# Patient Record
Sex: Female | Born: 1985 | Race: Black or African American | Hispanic: No | Marital: Married | Smoking: Never smoker
Health system: Southern US, Community
[De-identification: ages and names within clinical notes are randomized; demographics above are authoritative.]

## PROBLEM LIST (undated history)

## (undated) DIAGNOSIS — Z789 Other specified health status: Secondary | ICD-10-CM

## (undated) DIAGNOSIS — B181 Chronic viral hepatitis B without delta-agent: Secondary | ICD-10-CM

## (undated) DIAGNOSIS — I8390 Asymptomatic varicose veins of unspecified lower extremity: Secondary | ICD-10-CM

## (undated) HISTORY — DX: Asymptomatic varicose veins of unspecified lower extremity: I83.90

## (undated) HISTORY — DX: Chronic viral hepatitis B without delta-agent: B18.1

---

## 2006-12-08 ENCOUNTER — Ambulatory Visit (HOSPITAL_COMMUNITY): Admission: RE | Admit: 2006-12-08 | Discharge: 2006-12-08 | Payer: Self-pay | Admitting: Obstetrics and Gynecology

## 2007-04-20 ENCOUNTER — Ambulatory Visit (HOSPITAL_COMMUNITY): Admission: RE | Admit: 2007-04-20 | Discharge: 2007-04-20 | Payer: Self-pay | Admitting: Obstetrics

## 2007-05-04 ENCOUNTER — Inpatient Hospital Stay (HOSPITAL_COMMUNITY): Admission: AD | Admit: 2007-05-04 | Discharge: 2007-05-08 | Payer: Self-pay | Admitting: Obstetrics

## 2010-09-07 ENCOUNTER — Ambulatory Visit (HOSPITAL_COMMUNITY): Admission: RE | Admit: 2010-09-07 | Discharge: 2010-09-07 | Payer: Self-pay | Admitting: Obstetrics

## 2011-01-16 ENCOUNTER — Encounter: Payer: Self-pay | Admitting: Obstetrics

## 2011-02-02 ENCOUNTER — Inpatient Hospital Stay (HOSPITAL_COMMUNITY)
Admission: AD | Admit: 2011-02-02 | Discharge: 2011-02-05 | DRG: 765 | Disposition: A | Discharge status: Home / Self Care | Payer: Medicaid Other | Source: Ambulatory Visit | Attending: Obstetrics & Gynecology | Admitting: Obstetrics & Gynecology

## 2011-02-02 ENCOUNTER — Inpatient Hospital Stay (HOSPITAL_COMMUNITY)
Admission: AD | Admit: 2011-02-02 | Discharge: 2011-02-02 | Disposition: A | Discharge status: Home / Self Care | Payer: Medicaid Other | Source: Ambulatory Visit | Attending: Obstetrics | Admitting: Obstetrics

## 2011-02-02 DIAGNOSIS — O479 False labor, unspecified: Secondary | ICD-10-CM | POA: Insufficient documentation

## 2011-02-02 DIAGNOSIS — O34219 Maternal care for unspecified type scar from previous cesarean delivery: Secondary | ICD-10-CM | POA: Diagnosis present

## 2011-02-02 DIAGNOSIS — B182 Chronic viral hepatitis C: Secondary | ICD-10-CM | POA: Diagnosis present

## 2011-02-02 DIAGNOSIS — O26619 Liver and biliary tract disorders in pregnancy, unspecified trimester: Secondary | ICD-10-CM | POA: Diagnosis present

## 2011-02-02 LAB — CBC
Hemoglobin: 14.3 g/dL (ref 12.0–15.0)
MCH: 30.8 pg (ref 26.0–34.0)
MCHC: 35.8 g/dL (ref 30.0–36.0)
RBC: 4.65 MIL/uL (ref 3.87–5.11)

## 2011-02-02 LAB — RPR: RPR Ser Ql: NONREACTIVE

## 2011-02-03 LAB — CBC
HCT: 31.3 % — ABNORMAL LOW (ref 36.0–46.0)
Hemoglobin: 11 g/dL — ABNORMAL LOW (ref 12.0–15.0)
MCH: 30.1 pg (ref 26.0–34.0)
MCHC: 35.1 g/dL (ref 30.0–36.0)
Platelets: 124 10*3/uL — ABNORMAL LOW (ref 150–400)
RDW: 15.6 % — ABNORMAL HIGH (ref 11.5–15.5)

## 2011-02-09 NOTE — Discharge Summary (Signed)
  [name], Glenda [name]             ACCOUNT NO.:  [id number]  MEDICAL RECORD NO.:  [id number]           PATIENT TYPE:  I  LOCATION:  9118                          [facility]  [facility]  PHYSICIAN:  Kathreen Cosier, M.D.DATE OF BIRTH:  September 17, 1986  DATE OF ADMISSION:  02/02/2011 DATE OF DISCHARGE:  02/05/2011                              DISCHARGE SUMMARY   The patient is a 25 year old gravida 1 patient of Dr. Tamela Oddi who was admitted in labor on February 02, 2011.  Her EDC was January 28, 2011.  She labored actively and then underwent a low transverse cesarean section because of failure to progress.  She had a female Apgar 9 and 9, weighing 8 pounds from the OP position.  Postoperatively, she did well postpartum.  Her hemoglobin is 11.0.  She was discharged home on the third postoperative day ambulatory on a regular diet, to see me in 6 weeks.  DISCHARGE DIAGNOSIS:  Status post primary low transverse cesarean section at term for failure to progress in labor.          ______________________________ Kathreen Cosier, M.D.     BAM/MEDQ  D:  02/05/2011  T:  02/05/2011  Job:  [id number]  Electronically Signed by Francoise Ceo M.D. on 02/09/2011 07:18:14 AM

## 2011-02-13 NOTE — Op Note (Signed)
[name], [name]             ACCOUNT NO.:  [id number]  MEDICAL RECORD NO.:  [id number]           PATIENT TYPE:  I  LOCATION:  9118                          [facility]  PHYSICIAN:  Roseanna Rainbow, M.D.DATE OF BIRTH:  Feb 06, 1986  DATE OF PROCEDURE:  02/02/2011 DATE OF DISCHARGE:                              OPERATIVE REPORT   PREOPERATIVE DIAGNOSES:  Intrauterine pregnancy at term, history of a previous cesarean delivery, protracted latent phase of labor.  POSTOPERATIVE DIAGNOSES:  Intrauterine pregnancy at term, history of a previous cesarean delivery, protracted latent phase of labor.  PROCEDURE:  Repeat low uterine flap elliptical cesarean delivery via transverse incision.  SURGEON:  Roseanna Rainbow, M.D.  ANESTHESIA:  Epidural.  ESTIMATED BLOOD LOSS:  500 mL.  IV FLUIDS:  As per anesthesiology.  URINE OUTPUT:  As per anesthesiology.  COMPLICATIONS:  None.  FINDINGS:  Live born female, cephalic presentation, loose nuchal cord x1. Birth weight 8 pounds.  PROCEDURE:  The patient was taken to the operating room with an IV running and an epidural catheter in situ.  The patient was placed in the dorsal supine position with a leftward tilt and prepped and draped in the usual sterile fashion.  After time-out had been completed, the previous scar was then incised with the scalpel.  This incision was then extended down to the fascia with the Bovie.  The fascia was nicked in the midline.  The fascial incision was then extended bilaterally.  The superior aspect of the fascial incision was tented up and the underlying rectus muscles dissected off.  The inferior aspect of the fascial incision was manipulated in a similar fashion.  The rectus muscle was separated in the midline.  The parietal peritoneum was entered sharply. This incision was then extended superiorly and inferiorly with good visualization of the bladder.  The bladder blade was then placed.   The vesicouterine peritoneum was tented up and entered sharply.  This incision was then extended bilaterally.  The bladder flap was created using both sharp and blunt dissection.  The bladder blade was then replaced.  The lower uterine segment was incised in a transverse fashion with the scalpel.  This incision was extended bluntly.  The infant's head was delivered atraumatically.  The cord was clamped and cut.  The infant was handed off to the awaiting neonatologist.  The placenta was then removed.  The intrauterine cavity was evacuated of any remaining amniotic fluid, clots, and debris with a moistened laparotomy sponge. The uterine incision was then reapproximated in a running interlocking fashion and with suture of 0 Monocryl.  The second imbricating layer of the same suture was then placed.  Adequate hemostasis was noted.  The paracolic gutters were then irrigated.  The parietal peritoneum was reapproximated in a running fashion using a suture of 2-0 Vicryl.  The fascia was reapproximated with two running sutures of 0 PDS tied in the midline.  The skin was closed in a subcuticular fashion using a suture of 4-0 Monocryl.  At the close of the procedure, the instrument and pack counts were said to be correct x2.  The patient  taken to the PACU awake and in stable condition.     Roseanna Rainbow, M.D.     [name]  D:  02/02/2011  T:  02/03/2011  Job:  [id number]  Electronically Signed by Antionette Char M.D. on 02/13/2011 02:34:22 PM

## 2011-02-13 NOTE — H&P (Signed)
  NAMEADAMAE, Glenda [name]             ACCOUNT NO.:  [id number]  MEDICAL RECORD NO.:  [id number]           PATIENT TYPE:  I  LOCATION:  9118                          [facility]  PHYSICIAN:  Roseanna Rainbow, M.D.DATE OF BIRTH:  Aug 21, 1986  DATE OF ADMISSION:  02/02/2011 DATE OF DISCHARGE:                             HISTORY & PHYSICAL   CHIEF COMPLAINT:  The patient is a 25 year old para 1 with an estimated date of confinement of February 02, 2011, complaining of spontaneous rupture of membranes and contractions.  HISTORY OF PRESENT ILLNESS:  Please see the above.  The patient reports a spontaneous rupture of membranes for clear fluid 2 hours prior to presentation.  ALLERGIES:  No known drug allergies.  MEDICATIONS:  Please see the medication reconciliation form.  OB RISK FACTORS:  History of a previous cesarean delivery.  PRENATAL LABS:  Urine culture and sensitivity, no growth.  Hepatitis B surface antigen positive.  Platelets 177,000, blood type is A+, antibody screen negative, RPR nonreactive.  Quad screen negative.  Hemoglobin 11.8, hematocrit 34, HIV nonreactive.  Two-hour GTT normal.  PAST OB HISTORY:  In May 2008, she was delivered at term 7 pounds 12 ounces female via cesarean delivery.  PAST GYN HISTORY:  Noncontributory.  PAST MEDICAL HISTORY:  No significant history of medical diseases.  PAST SURGICAL HISTORY:  Please see the above.  SOCIAL HISTORY:  She is a Lawyer.  She is married, living with her spouse, does not give any significant history of alcohol usage, has no significant smoking history.  Denies illicit drug use.  FAMILY HISTORY:  No major illnesses known.  REVIEW OF SYSTEMS:  GU:  Please see the above.  PHYSICAL EXAMINATION:  VITAL SIGNS:  Stable and afebrile.  Fetal heart tracing baseline 140 beats per minute, moderate long-term variability, no decelerations.  Tocodynamometer uterine contractions every 4 minutes with coupling, sterile vaginal  exam per the RN, and there is gross rupture.  The cervix is 3 cm dilated, 100% effaced.  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  PLAN:  Admission, epidural for analgesia.  Expectant management, anticipated spontaneous vaginal delivery.     Roseanna Rainbow, M.D.     [name]  D:  02/02/2011  T:  02/02/2011  Job:  [id number]  Electronically Signed by Antionette Char M.D. on 02/13/2011 02:34:02 PM

## 2011-05-10 NOTE — H&P (Signed)
NAMEKINNLEY, PAULSON               ACCOUNT NO.:  [id number]   MEDICAL RECORD NO.:  [id number]          PATIENT TYPE:  INP   LOCATION:  9119                          [facility]  [facility]   PHYSICIAN:  Kathreen Cosier, M.D.DATE OF BIRTH:  [birth year]   DATE OF ADMISSION:  05/04/2007  DATE OF DISCHARGE:                              HISTORY & PHYSICAL   The patient is a 25 year old primigravida who was due May 12 who was  admitted at 1:00 on the 9th. History and physical was repeated by Dr.  Tamela Oddi. The patient was initially 3 cm, and by 6:05 p.m., she was  8-9. Vertex were -2. Membranes ruptured, fluid clear. The vertex was not  well applied to the cervix. By 10:40 p.m., the cervix appeared to be 7  cm with some molding to -1 station. She was started on low-dose Pitocin,  and her contractions never did become adequate, and the Pitocin was  discontinued, and because there was still no progress and she was  molded, she started having decelerations and had spontaneous  decelerations to 90 for 7 minutes. She was on O2. There was no change in  her pelvic findings. It was decided she would be delivered by cesarean  section for failure to progress and nonreassuring fetal heart tracing.           ______________________________  Kathreen Cosier, M.D.     BAM/MEDQ  D:  05/05/2007  T:  05/05/2007  Job:  [id number]

## 2011-05-10 NOTE — H&P (Signed)
NAMESHEMICKA, COHRS               ACCOUNT NO.:  [id number]   MEDICAL RECORD NO.:  [id number]          PATIENT TYPE:  INP   LOCATION:  9171                          [facility]   PHYSICIAN:  Roseanna Rainbow, M.D.DATE OF BIRTH:  09-17-1986   DATE OF ADMISSION:  05/04/2007  DATE OF DISCHARGE:                              HISTORY & PHYSICAL   CHIEF COMPLAINT:  The patient is a 25 year old gravida 1, para 0, with  an estimated date of confinement of May 12 with an intrauterine  pregnancy at 39+ weeks, complaining of uterine contractions.   HISTORY OF PRESENT ILLNESS:  Please see the above.   OB RISK FACTORS:  AC trait, hepatitis B surface antigen positive, with  associated abnormal liver transaminases, poor dentition, history of a  positive PPD, is status post INH.   PRENATAL LABS:  Hemoglobin 13.4, hematocrit 39.3, plate 621,308, blood  type A positive, antibody screen negative, RPR nonreactive, hepatitis B  surface antigen positive, rubella immune.  Hemoglobin electrophoresis  AC.  Varicella titer immune.  HIV nonreactive.  GC negative.  Chlamydia  negative.  Pap smear negative.  Urine culture and sensitivity:  No  uropathogens.  Quad screen normal.  One-hour GTT 82.  Ultrasound on  December 14 at 19 weeks 6 days.  Estimated date of confinement by  ultrasound Apr 28, 2007.  GBS is negative.   PAST GYN HISTORY:  Noncontributory.   PAST MEDICAL HISTORY:  Please see the above.   PAST SURGICAL HISTORY:  She denies.   SOCIAL HISTORY:  She denies any tobacco, ethanol or drug use.   FAMILY HISTORY:  Hypertension, diabetes.   ALLERGIES:  No known drug allergies.   PHYSICAL EXAMINATION:  VITAL SIGNS:  Stable.  Afebrile.  Fetal heart  tracing reassuring.  Toco dynamometer:  Uterine contractions every five  minutes.  STERILE VAGINAL EXAM:  Per the RN the cervix is 3 cm dilated, 80%  effaced.   ASSESSMENT:  Primigravida with an intrauterine pregnancy at term, latent  labor,  fetal heart tracing consistent with fetal well-being.   PLAN:  Admission.  Expectant management.      Roseanna Rainbow, M.D.  Electronically Signed     LAJ/MEDQ  D:  05/04/2007  T:  05/04/2007  Job:  [id number]

## 2011-05-10 NOTE — Op Note (Signed)
NAMEBEVA, REMUND               ACCOUNT NO.:  [id number]   MEDICAL RECORD NO.:  [id number]          PATIENT TYPE:  INP   LOCATION:  9119                          [facility]   PHYSICIAN:  Kathreen Cosier, M.D.DATE OF BIRTH:  1986-06-15   DATE OF PROCEDURE:  05/05/2007  DATE OF DISCHARGE:                               OPERATIVE REPORT   PREOPERATIVE DIAGNOSIS:  Failure to progress in labor, nonreassuring  fetal heart rate tracing.   SURGEON:  Dr. Gaynell Face.   ANESTHESIA:  Spinal.   PROCEDURE:  The patient placed on the operating table in the supine  position, after spinal administered by Dr. Arby Barrette.  Abdomen prepped  and draped.  Bladder emptied with Foley catheter.  Transverse suprapubic  incision made, carried down through the rectus fascia.  Fascia cleaned  and incised the length of incision.  Recti muscles retracted laterally.  Peritoneum incised longitudinally.  Transverse incision made in the  visceral peritoneum above the bladder and the bladder mobilized  inferiorly.  Transverse lower uterine incision made.  The patient  delivered of a female, Apgars 7 pounds 12 ounces from the OP position.  The team was in attendance.  Fluid was clear.  Placenta was anterior  fundal, removed manually and sent to labor and delivery.  Uterine cavity  cleaned with dry laps.  Uterine incision closed in 1 layer with  continuous suture of #1 chromic.  Hemostasis was satisfactory.  Bladder  flap reattached with 2-0 chromic.  Uterus well contracted.  Tubes and  ovaries normal.  Abdomen closed in layers, peritoneum continuous suture  of 0 chromic, fascia continuous suture of 0 Dexon, and the skin closed  with subcuticular stitch of 4-0 Monocryl.  Blood loss 500 mL.           ______________________________  Kathreen Cosier, M.D.     BAM/MEDQ  D:  05/05/2007  T:  05/05/2007  Job:  [id number]

## 2011-05-13 NOTE — Discharge Summary (Signed)
Glenda [name], Glenda [name]               ACCOUNT NO.:  [id number]   MEDICAL RECORD NO.:  [id number]          PATIENT TYPE:  INP   LOCATION:  9119                          [facility]   PHYSICIAN:  Roseanna Rainbow, M.D.DATE OF BIRTH:  1986/04/01   DATE OF ADMISSION:  05/04/2007  DATE OF DISCHARGE:  05/08/2007                               DISCHARGE SUMMARY   CHIEF COMPLAINT:  The patient is a 25 year old gravida 1, para 0, with  an estimated date of confinement of May 07, 2007, with an intrauterine  pregnancy of 39+ weeks, complaining of uterine contractions.   Please see the dictated history and physical for further details.  The  patient was admitted.  She progressed to 6-to-7-cm of dilatation.  At  this point there were variable decelerations and an amnioinfusion was  started.  There was no further progress and at that point a decision was  made to proceed with a cesarean delivery.  Please see the dictated  operative summary.   Her postoperative course was uneventful.  She was discharged to home on  postoperative day #3, tolerating a regular diet.   DISCHARGE DIAGNOSES:  1. Intrauterine pregnancy at term.  2. Arrest of dilatation active phase of labor.   PROCEDURE:  Primary low uterine elliptical cesarean delivery.   CONDITION:  Good.   DIET:  Regular.   ACTIVITY:  Progressive activity, pelvic rest.   MEDICATIONS:  Percocet.   DISPOSITION:  The patient was to follow up in the office in two weeks.      Roseanna Rainbow, M.D.  Electronically Signed     LAJ/MEDQ  D:  06/08/2007  T:  06/08/2007  Job:  [id number]

## 2012-05-15 ENCOUNTER — Ambulatory Visit (INDEPENDENT_AMBULATORY_CARE_PROVIDER_SITE_OTHER): Payer: 59 | Admitting: Internal Medicine

## 2012-05-15 ENCOUNTER — Ambulatory Visit: Payer: 59

## 2012-05-15 VITALS — BP 124/88 | HR 74 | Temp 97.6°F | Resp 16 | Ht 62.0 in | Wt 153.0 lb

## 2012-05-15 DIAGNOSIS — M546 Pain in thoracic spine: Secondary | ICD-10-CM

## 2012-05-15 MED ORDER — MELOXICAM 15 MG PO TABS: 15.0000 mg | ORAL_TABLET | Freq: Every day | ORAL | Status: AC

## 2012-05-15 MED ORDER — CYCLOBENZAPRINE HCL 10 MG PO TABS: ORAL_TABLET | ORAL | Status: DC

## 2012-05-15 NOTE — Progress Notes (Signed)
  Subjective:    Patient ID: Maragret Vanacker, female    DOB: 06-30-1986, 26 y.o.   MRN: [id number]  HPIShe has a 9 month history of intermittent pain in the mid back and occasionally lower back  That gets worse with her work. 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 There is no history of injury. This pain can interfere with sleep when it is exacerbated after lifting. There is no radicular component and she has no numbness anywhere.   Past hx=No medical illnesses/mirena/2 successful pregnancies/  Review of Systems  Constitutional: Negative for fever, chills, fatigue and unexpected weight change.  Respiratory: Negative for cough, chest tightness and shortness of breath.   Cardiovascular: Negative for palpitations and leg swelling.  Genitourinary: Negative for difficulty urinating.  Musculoskeletal: Negative for gait problem.       Objective:   Physical ExamVital signs stable Neck exam full range of motion without pain Both shoulders have full range of motion without pain except for mild impingement symptoms on the left with abduction greater than 90 Thoracic spine has a mild scoliosis to the right She is tender along the scapular border and into the right trapezius She is tender in the parathoracic muscles down to T12 Pain with twisting and tilting  And forward flexion Lumbar spine is intact with negative straight leg raise Deep tendon reflexes are symmetrical    [facility] reading (PRIMARY) by  Dr.Karlyn Glasco=Mild thoracic scoliosis but no other findings   Assessment & Plan:  Problem #1 thoracic pain secondary to chronic thoracic strain with mild scoliosis Meds ordered this encounter  Medications  . Multiple Vitamin (MULTIVITAMIN) capsule    Sig: Take 1 capsule by mouth daily.  Marland Kitchen levonorgestrel (MIRENA) 20 MCG/24HR IUD    Sig: 1 each by Intrauterine route once.  . cyclobenzaprine (FLEXERIL) 10 MG tablet    Sig: One at bedtime    Dispense:  30 tablet    Refill:  0  .  meloxicam (MOBIC) 15 MG tablet    Sig: Take 1 tablet (15 mg total) by mouth daily.    Dispense:  30 tablet    Refill:  0   Patient Instructions  Do your exercises daily Medicines have been sent to your pharmacy= [facility] in [place] We need to recheck you back in 3 weeks-you may call [phone] to find out what day I am scheduled for work that is convenient for you to return   Letter has been given to use indicating light duty lifting less than 20 pounds until followup exam

## 2012-05-15 NOTE — Patient Instructions (Addendum)
Do your exercises daily Medicines have been sent to your pharmacy= [facility] in [place] We need to recheck you back in 3 weeks-you may call [phone] to find out what day I am scheduled for work that is convenient for you to return

## 2012-05-17 ENCOUNTER — Telehealth: Payer: Self-pay

## 2012-05-17 NOTE — Telephone Encounter (Signed)
Pt called again to request to stay home for a couple of days to see how the medicine works for her.

## 2012-05-17 NOTE — Telephone Encounter (Signed)
PATIENT STATES SHE WAS SEEN 2 DAYS AGO BY DR. Merla Riches FOR BACK PAIN. HE GAVE HER CYCLOBENZAPRINE 10MG . THIS MEDICATION MAKES HER FEEL WEAK AND DIZZY. SHE WOULD LIKE TO BE OUT OF WORK FOR A FEW DAYS. SHE IS REQUESTING A OUT-OF-WORK NOTE PLEASE.  BEST PHONE [phone] (CELL)     PHARMACY CHOICE IS [facility] ON [address]       MBC

## 2012-05-17 NOTE — Telephone Encounter (Signed)
Do you want to OK OOW note?

## 2012-05-18 NOTE — Telephone Encounter (Signed)
Yes, OOW note ok

## 2012-05-20 NOTE — Telephone Encounter (Signed)
Called patient. Left message for patient to call back with the days she needed [place] to include.

## 2012-05-21 NOTE — Telephone Encounter (Signed)
Spoke w/pt and she stated she will just RTC today and be seen by provider bc Dr Merla Riches had written note that she could not lift over 20-30 lbs and her work is not allowing her to work saying there is not any light duty work for her. Also the medicine is still making her feel weak.

## 2012-05-30 ENCOUNTER — Telehealth: Payer: Self-pay

## 2012-05-30 NOTE — Telephone Encounter (Signed)
PATIENT IS REQUESTING THE X-RAYS OF HER BACK SHE HAD DONE A FEW WEEKS AGO. PLEASE CALL HER WHEN THEY ARE READY TO BE PICKED UP. BEST PHONE [phone] (CELL)  MBC

## 2012-05-31 NOTE — Telephone Encounter (Signed)
LMOM that copy of Xrays is ready for p/up

## 2012-06-01 ENCOUNTER — Telehealth: Payer: Self-pay

## 2012-06-01 NOTE — Telephone Encounter (Signed)
.  [facility] The patient called possibly to request referral from her visit on 05/15/12.  No referral noted in system.  Please call patient to discuss her needs regarding referral.

## 2012-06-02 NOTE — Telephone Encounter (Signed)
LMOM to CB.  Get info re: referral... Patient was to RTC.

## 2012-06-04 NOTE — Telephone Encounter (Signed)
LMOM to CB. 

## 2012-06-05 NOTE — Telephone Encounter (Signed)
Pt came in states she filled out form for release of records, she is filling out another one and please call when ready for pick up.Marland Kitchen

## 2012-06-05 NOTE — Telephone Encounter (Signed)
Pt told Glenda [name] when she filled out form that she did not need a referral, only needed her records. Med Recs were given the release of recs form.

## 2012-06-05 NOTE — Telephone Encounter (Signed)
LMOM regarding records are ready for pick-up. °

## 2012-06-25 ENCOUNTER — Ambulatory Visit: Payer: 59 | Attending: Internal Medicine

## 2012-06-25 DIAGNOSIS — M546 Pain in thoracic spine: Secondary | ICD-10-CM | POA: Insufficient documentation

## 2012-06-25 DIAGNOSIS — IMO0001 Reserved for inherently not codable concepts without codable children: Secondary | ICD-10-CM | POA: Insufficient documentation

## 2012-06-27 ENCOUNTER — Encounter: Payer: 59 | Admitting: Physical Therapy

## 2012-07-05 ENCOUNTER — Encounter: Payer: 59 | Admitting: Physical Therapy

## 2012-07-05 ENCOUNTER — Ambulatory Visit: Payer: 59 | Admitting: Physical Therapy

## 2012-07-11 ENCOUNTER — Ambulatory Visit: Payer: 59 | Admitting: Physical Therapy

## 2012-07-31 ENCOUNTER — Ambulatory Visit: Payer: 59 | Admitting: Physical Therapy

## 2013-06-17 ENCOUNTER — Encounter: Payer: Self-pay | Admitting: Obstetrics

## 2013-07-24 ENCOUNTER — Encounter: Payer: 59 | Admitting: Obstetrics

## 2013-07-25 ENCOUNTER — Encounter: Payer: Self-pay | Admitting: Obstetrics

## 2013-07-25 ENCOUNTER — Ambulatory Visit (INDEPENDENT_AMBULATORY_CARE_PROVIDER_SITE_OTHER): Payer: BC Managed Care – PPO | Admitting: Obstetrics

## 2013-07-25 VITALS — BP 110/73 | Temp 97.7°F | Wt 163.0 lb

## 2013-07-25 DIAGNOSIS — Z369 Encounter for antenatal screening, unspecified: Secondary | ICD-10-CM

## 2013-07-25 DIAGNOSIS — Z3482 Encounter for supervision of other normal pregnancy, second trimester: Secondary | ICD-10-CM

## 2013-07-25 DIAGNOSIS — Z3201 Encounter for pregnancy test, result positive: Secondary | ICD-10-CM

## 2013-07-25 LAB — POCT URINALYSIS DIPSTICK
Bilirubin, UA: NEGATIVE
Blood, UA: NEGATIVE
Glucose, UA: NEGATIVE
Ketones, UA: NEGATIVE
Leukocytes, UA: NEGATIVE
Spec Grav, UA: 1.02

## 2013-07-25 MED ORDER — VITAFOL-OB PO TABS: 1.0000 | ORAL_TABLET | Freq: Every day | ORAL | Status: DC

## 2013-07-25 NOTE — Progress Notes (Signed)
Pulse-89   Subjective:    Glenda [name] is being seen today for her first obstetrical visit.  This is a planned pregnancy. She is at [redacted]w[redacted]d gestation. Her obstetrical history is significant for none. Relationship with FOB: spouse, living together. Patient does intend to breast feed. Pregnancy history fully reviewed.  Menstrual History: OB History   Grav Para Term Preterm Abortions TAB SAB Ect Mult Living   3 2 2              Menarche age: 27  Patient's last menstrual period was 04/06/2013.    The following portions of the patient's history were reviewed and updated as appropriate: allergies, current medications, past family history, past medical history, past social history, past surgical history and problem list.  Review of Systems Pertinent items are noted in HPI.    Objective:    General appearance: alert and no distress Abdomen: normal findings: soft, non-tender Pelvic: cervix normal in appearance, external genitalia normal, no adnexal masses or tenderness, no cervical motion tenderness, uterus normal size, shape, and consistency and vagina normal without discharge Extremities: extremities normal, atraumatic, no cyanosis or edema    Assessment:    Pregnancy at [redacted]w[redacted]d weeks    Plan:    Initial labs drawn. Prenatal vitamins.  Counseling provided regarding continued use of seat belts, cessation of alcohol consumption, smoking or use of illicit drugs; infection precautions i.e., influenza/TDAP immunizations, toxoplasmosis,CMV, parvovirus, listeria and varicella; workplace safety, exercise during pregnancy; routine dental care, safe medications, sexual activity, hot tubs, saunas, pools, travel, caffeine use, fish and methlymercury, potential toxins, hair treatments, varicose veins Weight gain recommendations reviewed: underweight/BMI< 18.5--> gain 28 - 40 lbs; normal weight/BMI 18.5 - 24.9--> gain 25 - 35 lbs; overweight/BMI 25 - 29.9--> gain 15 - 25 lbs; obese/BMI >30->gain  11 -  20 lbs Problem list reviewed and updated. AFP3 discussed: requested. Role of ultrasound in pregnancy discussed; fetal survey: requested. Amniocentesis discussed: not indicated. Follow up in 4 weeks. 50% of 20 min visit spent on counseling and coordination of care.

## 2013-07-26 LAB — OBSTETRIC PANEL
Antibody Screen: NEGATIVE
Basophils Relative: 0 % (ref 0–1)
Eosinophils Absolute: 0.1 10*3/uL (ref 0.0–0.7)
Eosinophils Relative: 1 % (ref 0–5)
HCT: 38 % (ref 36.0–46.0)
Lymphs Abs: 2.1 10*3/uL (ref 0.7–4.0)
Monocytes Relative: 6 % (ref 3–12)
Neutro Abs: 5.4 10*3/uL (ref 1.7–7.7)
RBC: 4.12 MIL/uL (ref 3.87–5.11)
WBC: 8.1 10*3/uL (ref 4.0–10.5)

## 2013-07-26 LAB — HIV ANTIBODY (ROUTINE TESTING W REFLEX): HIV: NONREACTIVE

## 2013-07-26 LAB — VARICELLA ZOSTER ANTIBODY, IGG: Varicella IgG: 54.37 Index (ref <135.00)

## 2013-07-26 LAB — VITAMIN D 25 HYDROXY (VIT D DEFICIENCY, FRACTURES): Vit D, 25-Hydroxy: 43 ng/mL (ref 30–89)

## 2013-07-26 LAB — PAP IG W/ RFLX HPV ASCU

## 2013-07-26 LAB — GC/CHLAMYDIA PROBE AMP: CT Probe RNA: NEGATIVE

## 2013-07-26 LAB — WET PREP BY MOLECULAR PROBE: Candida species: NEGATIVE

## 2013-07-26 LAB — HEPATITIS B SURF AG CONFIRMATION: Hepatitis B Surf Ag Confirmation: POSITIVE — AB

## 2013-07-27 LAB — URINE CULTURE: Colony Count: NO GROWTH

## 2013-07-29 LAB — AFP, QUAD SCREEN
AFP: 35.1 IU/mL
Age Alone: 1:834 {titer}
Down Syndrome Scr Risk Est: 1:10100 {titer}
INH: 214.1 pg/mL
MoM for INH: 1.15
Open Spina bifida: NEGATIVE
Trisomy 18 (Edward) Syndrome Interp.: 1:47900 {titer}
uE3 Mom: 1.12

## 2013-07-29 LAB — HEMOGLOBINOPATHY EVALUATION
Hemoglobin Other: 37 % — ABNORMAL HIGH
Hgb A: 59.9 % — ABNORMAL LOW (ref 96.8–97.8)
Hgb F Quant: 0 % (ref 0.0–2.0)
Hgb S Quant: 0 %

## 2013-08-05 LAB — HGB ELECTROPHORESIS REFLEXED REPORT
Hemoglobin A - HGBRFX: 57.2 % — ABNORMAL LOW (ref >96.0)
Hemoglobin A2 - HGBRFX: 3.2 % (ref 1.8–3.5)
Hemoglobin F - HGBRFX: 0 % (ref <2.0)
Sickle Solubility Test - HGBRFX: NEGATIVE

## 2013-08-20 ENCOUNTER — Ambulatory Visit (INDEPENDENT_AMBULATORY_CARE_PROVIDER_SITE_OTHER): Payer: BC Managed Care – PPO

## 2013-08-20 DIAGNOSIS — Z369 Encounter for antenatal screening, unspecified: Secondary | ICD-10-CM

## 2013-08-28 ENCOUNTER — Encounter: Payer: BC Managed Care – PPO | Admitting: Obstetrics

## 2013-08-29 ENCOUNTER — Encounter: Payer: BC Managed Care – PPO | Admitting: Obstetrics

## 2013-09-02 ENCOUNTER — Encounter: Payer: BC Managed Care – PPO | Admitting: Obstetrics

## 2013-09-05 ENCOUNTER — Ambulatory Visit (INDEPENDENT_AMBULATORY_CARE_PROVIDER_SITE_OTHER): Payer: BC Managed Care – PPO | Admitting: Obstetrics

## 2013-09-05 ENCOUNTER — Encounter: Payer: Self-pay | Admitting: Obstetrics

## 2013-09-05 VITALS — BP 109/69 | Temp 99.2°F | Wt 169.0 lb

## 2013-09-05 DIAGNOSIS — Z3480 Encounter for supervision of other normal pregnancy, unspecified trimester: Secondary | ICD-10-CM

## 2013-09-05 LAB — POCT URINALYSIS DIPSTICK
Bilirubin, UA: NEGATIVE
Blood, UA: NEGATIVE
Glucose, UA: NEGATIVE
Spec Grav, UA: >=1.03
pH, UA: 5

## 2013-09-05 NOTE — Progress Notes (Signed)
P 83 Patient states her varicose veins are worse. Patient states otherwise she is ok.

## 2013-10-03 ENCOUNTER — Other Ambulatory Visit: Payer: 59

## 2013-10-03 ENCOUNTER — Encounter: Payer: Self-pay | Admitting: Obstetrics

## 2013-10-03 ENCOUNTER — Ambulatory Visit (INDEPENDENT_AMBULATORY_CARE_PROVIDER_SITE_OTHER): Payer: Medicaid Other | Admitting: Obstetrics

## 2013-10-03 VITALS — BP 115/70 | Temp 96.7°F | Wt 171.0 lb

## 2013-10-03 DIAGNOSIS — Z3400 Encounter for supervision of normal first pregnancy, unspecified trimester: Secondary | ICD-10-CM

## 2013-10-03 DIAGNOSIS — Z3402 Encounter for supervision of normal first pregnancy, second trimester: Secondary | ICD-10-CM

## 2013-10-03 LAB — CBC
HCT: 34.4 % — ABNORMAL LOW (ref 36.0–46.0)
Hemoglobin: 12 g/dL (ref 12.0–15.0)
MCH: 33.3 pg (ref 26.0–34.0)
MCHC: 34.9 g/dL (ref 30.0–36.0)
RBC: 3.6 MIL/uL — ABNORMAL LOW (ref 3.87–5.11)

## 2013-10-03 LAB — POCT URINALYSIS DIPSTICK
Bilirubin, UA: NEGATIVE
Protein, UA: NEGATIVE
Spec Grav, UA: 1.01
Urobilinogen, UA: NEGATIVE
pH, UA: 7

## 2013-10-03 NOTE — Progress Notes (Signed)
Pulse- 89 Pt states she is still having pain with her varicose veins.

## 2013-10-04 LAB — SYPHILIS: RPR W/REFLEX TO RPR TITER AND TREPONEMAL ANTIBODIES, TRADITIONAL SCREENING AND DIAGNOSIS ALGORITHM

## 2013-10-17 ENCOUNTER — Ambulatory Visit (INDEPENDENT_AMBULATORY_CARE_PROVIDER_SITE_OTHER): Payer: Medicaid Other | Admitting: Obstetrics

## 2013-10-17 ENCOUNTER — Encounter: Payer: Self-pay | Admitting: Obstetrics

## 2013-10-17 VITALS — BP 115/70 | Temp 97.0°F | Wt 169.0 lb

## 2013-10-17 DIAGNOSIS — Z3482 Encounter for supervision of other normal pregnancy, second trimester: Secondary | ICD-10-CM

## 2013-10-17 DIAGNOSIS — Z3483 Encounter for supervision of other normal pregnancy, third trimester: Secondary | ICD-10-CM

## 2013-10-17 DIAGNOSIS — Z3480 Encounter for supervision of other normal pregnancy, unspecified trimester: Secondary | ICD-10-CM

## 2013-10-17 LAB — POCT URINALYSIS DIPSTICK
Bilirubin, UA: NEGATIVE
Blood, UA: NEGATIVE
Glucose, UA: NEGATIVE
Ketones, UA: NEGATIVE
Nitrite, UA: NEGATIVE
Protein, UA: NEGATIVE
Spec Grav, UA: 1.01
Urobilinogen, UA: NEGATIVE
pH, UA: 7

## 2013-10-17 NOTE — Progress Notes (Signed)
HR-93 Routine OB visit, no new concerns at present, reports pressure to lower abd and pelvis, report infrequent irregular painless contractions.

## 2013-10-24 ENCOUNTER — Encounter: Payer: BC Managed Care – PPO | Admitting: Obstetrics

## 2013-10-31 ENCOUNTER — Encounter: Payer: 59 | Admitting: Obstetrics

## 2013-10-31 ENCOUNTER — Encounter: Payer: 59 | Admitting: Obstetrics & Gynecology

## 2013-11-07 ENCOUNTER — Encounter: Payer: Self-pay | Admitting: Obstetrics & Gynecology

## 2013-11-07 ENCOUNTER — Ambulatory Visit (INDEPENDENT_AMBULATORY_CARE_PROVIDER_SITE_OTHER): Payer: 59 | Admitting: Obstetrics & Gynecology

## 2013-11-07 VITALS — BP 110/68 | Temp 97.0°F | Wt 171.0 lb

## 2013-11-07 DIAGNOSIS — Z3403 Encounter for supervision of normal first pregnancy, third trimester: Secondary | ICD-10-CM

## 2013-11-07 DIAGNOSIS — Z3400 Encounter for supervision of normal first pregnancy, unspecified trimester: Secondary | ICD-10-CM

## 2013-11-07 LAB — POCT URINALYSIS DIPSTICK
Bilirubin, UA: NEGATIVE
Blood, UA: NEGATIVE
Ketones, UA: NEGATIVE
Protein, UA: NEGATIVE
pH, UA: 7

## 2013-11-07 NOTE — Progress Notes (Signed)
Pulse-90 Pt states she has contractions and pain that come every now and then

## 2013-11-18 ENCOUNTER — Encounter: Payer: Self-pay | Admitting: Obstetrics

## 2013-11-28 ENCOUNTER — Ambulatory Visit (INDEPENDENT_AMBULATORY_CARE_PROVIDER_SITE_OTHER): Payer: Medicaid Other | Admitting: Obstetrics & Gynecology

## 2013-11-28 ENCOUNTER — Encounter: Payer: Self-pay | Admitting: Obstetrics & Gynecology

## 2013-11-28 ENCOUNTER — Encounter: Payer: 59 | Admitting: Obstetrics & Gynecology

## 2013-11-28 VITALS — BP 119/75 | Temp 97.1°F | Wt 173.0 lb

## 2013-11-28 DIAGNOSIS — Z3400 Encounter for supervision of normal first pregnancy, unspecified trimester: Secondary | ICD-10-CM

## 2013-11-28 DIAGNOSIS — Z23 Encounter for immunization: Secondary | ICD-10-CM

## 2013-11-28 DIAGNOSIS — Z3403 Encounter for supervision of normal first pregnancy, third trimester: Secondary | ICD-10-CM

## 2013-11-28 DIAGNOSIS — N390 Urinary tract infection, site not specified: Secondary | ICD-10-CM

## 2013-11-28 LAB — POCT URINALYSIS DIPSTICK
Bilirubin, UA: NEGATIVE
Ketones, UA: NEGATIVE
Urobilinogen, UA: NEGATIVE

## 2013-11-28 NOTE — Progress Notes (Signed)
Pulse- 100 Pt states she is having some lower abdomen pressure.

## 2013-11-29 LAB — URINE CULTURE: Colony Count: NO GROWTH

## 2013-11-30 NOTE — Patient Instructions (Signed)
Patient information: Group B streptococcus and pregnancy (Beyond the Basics)  Authors Karen M Puopolo, MD, PhD Carol J Baker, MD Section Editors Charles J Lockwood, MD Daniel J Sexton, MD Deputy Editor Vanessa A Barss, MD Disclosures  All topics are updated as new evidence becomes available and our peer review process is complete.  Literature review current through: Feb 2014.  This topic last updated: Jun 25, 2012.  INTRODUCTION - Group B streptococcus (GBS) is a bacterium that can cause serious infections in pregnant women and newborn babies. GBS is one of many types of streptococcal bacteria, sometimes called "strep." This article discusses GBS, its effect on pregnant women and infants, and ways to prevent complications of GBS. More detailed information about GBS is available by subscription. (See "Group B streptococcal infection in pregnant women".) WHAT IS GROUP B STREP INFECTION? - GBS is commonly found in the digestive system and the vagina. In healthy adults, GBS is not harmful and does not cause problems. But in pregnant women and newborn infants, being infected with GBS can cause serious illness. Approximately one in three to four pregnant women in the [place] carries GBS in their gastrointestinal system and/or in their vagina. Carrying GBS is not the same as being infected. Carriers are not sick and do not need treatment during pregnancy. There is no treatment that can stop you from carrying GBS.  Pregnant women who are carriers of GBS infrequently become infected with GBS. GBS can cause urinary tract infections, infection of the amniotic fluid (bag of water), and infection of the uterus after delivery. GBS infections during pregnancy may lead to preterm labor.  Pregnant women who carry GBS can pass on the bacteria to their newborns, and some of those babies become infected with GBS. Newborns who are infected with GBS can develop pneumonia (lung infection), septicemia (blood infection), or  meningitis (infection of the lining of the brain and spinal cord). These complications can be prevented by giving intravenous antibiotics during labor to any woman who is at risk of GBS infection. You are at risk of GBS infection if: You have a urine culture during your current pregnancy showing GBS  You have a vaginal and rectal culture during your current pregnancy showing GBS  You had an infant infected with GBS in the past GROUP B STREP PREVENTION - Most doctors and nurses recommend a urine culture early in your pregnancy to be sure that you do not have a bladder infection without symptoms. If you urine culture shows GBS or other bacteria, you may be treated with an antibiotic. If you have symptoms of urinary infection, such as pain with urination, any time during your pregnancy, a urine culture is done. If GBS grows from the urine culture, it should be treated with an antibiotic, and you should also receive intravenous antibiotics during labor. Expert groups recommend that all pregnant women have a GBS culture at 35 to 37 weeks of pregnancy. The culture is done by swabbing the vagina and rectum. If your GBS culture is positive, you will be given an intravenous antibiotic during labor. If you have preterm labor, the culture is done then and an intravenous antibiotic is given until the baby is born or the labor is stopped by your health care provider. If you have a positive GBS culture and you have an allergy to penicillin, be sure your doctor and nurse are aware of this allergy and tell them what happened with the allergy. If you had only a rash or itching, this   is not a serious allergy, and you can receive a common drug related to the penicillin. If you had a serious allergy (for example, trouble breathing, swelling of your face) you may need an additional test to determine which antibiotic should be used during labor. Being treated with an antibiotic during labor greatly reduces the chance that you or  your newborn will develop infections related to GBS. It is important to note that young infants up to age 3 months can also develop septicemia, meningitis and other serious infections from GBS. Being treated with an antibiotic during labor does not reduce the chance that your baby will develop this later type of infection. There is currently no known way of preventing this later-onset GBS disease. WHERE TO GET MORE INFORMATION - Your healthcare provider is the best source of information for questions and concerns related to your medical problem.  

## 2013-12-03 ENCOUNTER — Other Ambulatory Visit: Payer: Self-pay | Admitting: *Deleted

## 2013-12-03 DIAGNOSIS — O365931 Maternal care for other known or suspected poor fetal growth, third trimester, fetus 1: Secondary | ICD-10-CM

## 2013-12-04 ENCOUNTER — Other Ambulatory Visit: Payer: Medicaid Other

## 2013-12-12 ENCOUNTER — Encounter: Payer: Self-pay | Admitting: Obstetrics & Gynecology

## 2013-12-12 ENCOUNTER — Ambulatory Visit (INDEPENDENT_AMBULATORY_CARE_PROVIDER_SITE_OTHER): Payer: Medicaid Other | Admitting: Obstetrics & Gynecology

## 2013-12-12 VITALS — BP 113/73 | Temp 97.9°F | Wt 174.0 lb

## 2013-12-12 DIAGNOSIS — Z98891 History of uterine scar from previous surgery: Secondary | ICD-10-CM

## 2013-12-12 DIAGNOSIS — Z3480 Encounter for supervision of other normal pregnancy, unspecified trimester: Secondary | ICD-10-CM | POA: Insufficient documentation

## 2013-12-12 DIAGNOSIS — Z3483 Encounter for supervision of other normal pregnancy, third trimester: Secondary | ICD-10-CM

## 2013-12-12 DIAGNOSIS — Z3403 Encounter for supervision of normal first pregnancy, third trimester: Secondary | ICD-10-CM

## 2013-12-12 LAB — POCT URINALYSIS DIPSTICK
Bilirubin, UA: NEGATIVE
Ketones, UA: NEGATIVE

## 2013-12-12 NOTE — Progress Notes (Signed)
HR - 99 Pt in office today for routine OB visit.  Doing well.

## 2013-12-14 LAB — STREP B DNA PROBE: GBSP: NEGATIVE

## 2013-12-15 NOTE — Patient Instructions (Signed)
Cesarean Delivery  Cesarean delivery is the birth of a baby through a cut (incision) in the abdomen and womb (uterus).  LET YOUR CAREGIVER KNOW ABOUT:  Complicationsinvolving the pregnancy.  Allergies.  Medicines taken including herbs, eyedrops, over-the-counter medicines, and creams.  Use of steroids (by mouth or creams).  Previous problems with anesthetics or numbing medicine.  Previous surgery.  History of blood clots.  History of bleeding or blood problems.  Other health problems. RISKS AND COMPLICATIONS   Bleeding.  Infection.  Blood clots.  Injury to surrounding organs.  Anesthesia problems.  Injury to the baby. BEFORE THE PROCEDURE   A tube (Foley catheter) will be placed in your bladder. The Foley catheter drains the urine from your bladder into a bag. This keeps your bladder empty during surgery.  An intravenous access tube (IV) will be placed in your arm.  Hair may be removed from your pubic area and your lower abdomen. This is to prevent infection in the incision site.  You may be given an antacid medicine to drink. This will prevent acid contents in your stomach from going into your lungs if you vomit during the surgery.  You may be given an antibiotic medicine to prevent infection. PROCEDURE   You may be given medicine to numb the lower half of your body (regional anesthetic). If you were in labor, you may have already had an epidural in place which can be used in both labor and cesarean delivery. You may possibly be given medicine to make you sleep (general anesthetic) though this is not as common.  An incision will be made in your abdomen that extends to your uterus. There are 2 basic kinds of incisions:  The horizontal (transverse) incision. Horizontal incisions are used for most routine cesarean deliveries.  The vertical (up and down) incision. This is less commonly used. This is most often reserved for women who have a serious complication  (extreme prematurity) or under emergency situations.  The horizontal and vertical incisions may both be used at the same time. However, this is very uncommon.  Your baby will then be delivered. AFTER THE PROCEDURE   If you were awake during the surgery, you will see your baby right away. If you were asleep, you will see your baby as soon as you are awake.  You may breastfeed your baby after surgery.  You may be able to get up and walk the same day as the surgery. If you need to stay in bed for a period of time, you will receive help to turn, cough, and take deep breaths after surgery. This helps prevent lung problems such as pneumonia.  Do not get out of bed alone the first time after surgery. You will need help getting out of bed until you are able to do this by yourself.  You may be able to shower the day after your cesarean delivery. After the bandage (dressing) is taken off the incision site, a nurse will assist you to shower, if you like.  You will have pneumatic compressing hose placed on your feet or lower legs. These hose are used to prevent blood clots. When you are up and walking regularly, they will no longer be necessary.  Do not cross your legs when you sit.  Save any blood clots that you pass. If you pass a clot while on the toilet, do not flush it. Call for the nurse. Tell the nurse if you think you are bleeding too much or passing too many   clots.  Start drinking liquids and eating food as directed by your caregiver. If your stomach is not ready, drinking and eating too soon can cause an increase in bloating and swelling of your intestine and abdomen. This is very uncomfortable.  You will be given medicine as needed. Let your caregivers know if you are hurting. They want you to be comfortable. You may also be given an antibiotic to prevent an infection.  Your IV will be taken out when you are drinking a reasonable amount of fluids. The Foley catheter is taken out when  you are up and walking.  If your blood type is Rh negative and your baby's blood type is Rh positive, you will be given a shot of anti-D immune globulin. This shot prevents you from having Rh problems with a future pregnancy. You should get the shot even if you had your tubes tied (tubal ligation).  If you are allowed to take the baby for a walk, place the baby in the bassinet and push it. Do not carry your baby in your arms. Document Released: 12/12/2005 Document Revised: 03/05/2012 Document Reviewed: 07/03/2013 [employer] Patient Information 2014 [employer].  

## 2013-12-23 ENCOUNTER — Ambulatory Visit (INDEPENDENT_AMBULATORY_CARE_PROVIDER_SITE_OTHER): Payer: Medicaid Other | Admitting: Obstetrics & Gynecology

## 2013-12-23 VITALS — BP 107/70 | Temp 98.5°F | Wt 175.0 lb

## 2013-12-23 DIAGNOSIS — Z3480 Encounter for supervision of other normal pregnancy, unspecified trimester: Secondary | ICD-10-CM

## 2013-12-23 DIAGNOSIS — Z3483 Encounter for supervision of other normal pregnancy, third trimester: Secondary | ICD-10-CM

## 2013-12-23 LAB — POCT URINALYSIS DIPSTICK
Blood, UA: NEGATIVE
Glucose, UA: NEGATIVE
Leukocytes, UA: NEGATIVE
Nitrite, UA: NEGATIVE
Urobilinogen, UA: NEGATIVE

## 2013-12-23 NOTE — Progress Notes (Signed)
Pulse 87 Pt is doing well.

## 2013-12-24 ENCOUNTER — Encounter: Payer: Self-pay | Admitting: Obstetrics & Gynecology

## 2013-12-24 ENCOUNTER — Other Ambulatory Visit: Payer: Self-pay | Admitting: *Deleted

## 2013-12-24 DIAGNOSIS — O365990 Maternal care for other known or suspected poor fetal growth, unspecified trimester, not applicable or unspecified: Secondary | ICD-10-CM

## 2013-12-24 DIAGNOSIS — O365931 Maternal care for other known or suspected poor fetal growth, third trimester, fetus 1: Secondary | ICD-10-CM

## 2013-12-25 ENCOUNTER — Ambulatory Visit (HOSPITAL_COMMUNITY)
Admission: RE | Admit: 2013-12-25 | Discharge: 2013-12-25 | Disposition: A | Discharge status: Home / Self Care | Payer: Medicaid Other | Source: Ambulatory Visit | Attending: Obstetrics & Gynecology | Admitting: Obstetrics & Gynecology

## 2013-12-25 DIAGNOSIS — Z3689 Encounter for other specified antenatal screening: Secondary | ICD-10-CM | POA: Insufficient documentation

## 2013-12-25 DIAGNOSIS — O365990 Maternal care for other known or suspected poor fetal growth, unspecified trimester, not applicable or unspecified: Secondary | ICD-10-CM | POA: Insufficient documentation

## 2013-12-27 ENCOUNTER — Encounter (HOSPITAL_COMMUNITY): Payer: Self-pay | Admitting: Pharmacist

## 2013-12-27 ENCOUNTER — Encounter: Payer: Self-pay | Admitting: Obstetrics & Gynecology

## 2013-12-30 ENCOUNTER — Ambulatory Visit (INDEPENDENT_AMBULATORY_CARE_PROVIDER_SITE_OTHER): Payer: Medicaid Other | Admitting: Obstetrics & Gynecology

## 2013-12-30 ENCOUNTER — Other Ambulatory Visit: Payer: Self-pay | Admitting: *Deleted

## 2013-12-30 VITALS — BP 109/69 | Temp 98.2°F | Wt 174.0 lb

## 2013-12-30 DIAGNOSIS — Z3480 Encounter for supervision of other normal pregnancy, unspecified trimester: Secondary | ICD-10-CM

## 2013-12-30 DIAGNOSIS — Z3483 Encounter for supervision of other normal pregnancy, third trimester: Secondary | ICD-10-CM

## 2013-12-30 LAB — POCT URINALYSIS DIPSTICK
BILIRUBIN UA: NEGATIVE
GLUCOSE UA: NEGATIVE
KETONES UA: NEGATIVE
Leukocytes, UA: NEGATIVE
NITRITE UA: NEGATIVE
Protein, UA: NEGATIVE
RBC UA: NEGATIVE
Spec Grav, UA: 1.015
Urobilinogen, UA: NEGATIVE
pH, UA: 6

## 2013-12-30 NOTE — Progress Notes (Signed)
HR - 98 Pt in office today for routine OB visit, denies any concerns at this time

## 2013-12-30 NOTE — Patient Instructions (Signed)
Cesarean Delivery  Cesarean delivery is the birth of a baby through a cut (incision) in the abdomen and womb (uterus).  LET YOUR CAREGIVER KNOW ABOUT:  Complicationsinvolving the pregnancy.  Allergies.  Medicines taken including herbs, eyedrops, over-the-counter medicines, and creams.  Use of steroids (by mouth or creams).  Previous problems with anesthetics or numbing medicine.  Previous surgery.  History of blood clots.  History of bleeding or blood problems.  Other health problems. RISKS AND COMPLICATIONS   Bleeding.  Infection.  Blood clots.  Injury to surrounding organs.  Anesthesia problems.  Injury to the baby. BEFORE THE PROCEDURE   A tube (Foley catheter) will be placed in your bladder. The Foley catheter drains the urine from your bladder into a bag. This keeps your bladder empty during surgery.  An intravenous access tube (IV) will be placed in your arm.  Hair may be removed from your pubic area and your lower abdomen. This is to prevent infection in the incision site.  You may be given an antacid medicine to drink. This will prevent acid contents in your stomach from going into your lungs if you vomit during the surgery.  You may be given an antibiotic medicine to prevent infection. PROCEDURE   You may be given medicine to numb the lower half of your body (regional anesthetic). If you were in labor, you may have already had an epidural in place which can be used in both labor and cesarean delivery. You may possibly be given medicine to make you sleep (general anesthetic) though this is not as common.  An incision will be made in your abdomen that extends to your uterus. There are 2 basic kinds of incisions:  The horizontal (transverse) incision. Horizontal incisions are used for most routine cesarean deliveries.  The vertical (up and down) incision. This is less commonly used. This is most often reserved for women who have a serious complication  (extreme prematurity) or under emergency situations.  The horizontal and vertical incisions may both be used at the same time. However, this is very uncommon.  Your baby will then be delivered. AFTER THE PROCEDURE   If you were awake during the surgery, you will see your baby right away. If you were asleep, you will see your baby as soon as you are awake.  You may breastfeed your baby after surgery.  You may be able to get up and walk the same day as the surgery. If you need to stay in bed for a period of time, you will receive help to turn, cough, and take deep breaths after surgery. This helps prevent lung problems such as pneumonia.  Do not get out of bed alone the first time after surgery. You will need help getting out of bed until you are able to do this by yourself.  You may be able to shower the day after your cesarean delivery. After the bandage (dressing) is taken off the incision site, a nurse will assist you to shower, if you like.  You will have pneumatic compressing hose placed on your feet or lower legs. These hose are used to prevent blood clots. When you are up and walking regularly, they will no longer be necessary.  Do not cross your legs when you sit.  Save any blood clots that you pass. If you pass a clot while on the toilet, do not flush it. Call for the nurse. Tell the nurse if you think you are bleeding too much or passing too many  clots.  Start drinking liquids and eating food as directed by your caregiver. If your stomach is not ready, drinking and eating too soon can cause an increase in bloating and swelling of your intestine and abdomen. This is very uncomfortable.  You will be given medicine as needed. Let your caregivers know if you are hurting. They want you to be comfortable. You may also be given an antibiotic to prevent an infection.  Your IV will be taken out when you are drinking a reasonable amount of fluids. The Foley catheter is taken out when  you are up and walking.  If your blood type is Rh negative and your baby's blood type is Rh positive, you will be given a shot of anti-D immune globulin. This shot prevents you from having Rh problems with a future pregnancy. You should get the shot even if you had your tubes tied (tubal ligation).  If you are allowed to take the baby for a walk, place the baby in the bassinet and push it. Do not carry your baby in your arms. Document Released: 12/12/2005 Document Revised: 03/05/2012 Document Reviewed: 07/03/2013 [facility] Patient Information 2014 [employer].

## 2014-01-03 ENCOUNTER — Encounter (HOSPITAL_COMMUNITY): Payer: Self-pay

## 2014-01-03 ENCOUNTER — Encounter (HOSPITAL_COMMUNITY)
Admission: RE | Admit: 2014-01-03 | Discharge: 2014-01-03 | Disposition: A | Discharge status: Home / Self Care | Payer: Medicaid Other | Source: Ambulatory Visit | Attending: Obstetrics & Gynecology | Admitting: Obstetrics & Gynecology

## 2014-01-03 ENCOUNTER — Encounter: Payer: Self-pay | Admitting: Obstetrics

## 2014-01-03 DIAGNOSIS — Z01812 Encounter for preprocedural laboratory examination: Secondary | ICD-10-CM

## 2014-01-03 DIAGNOSIS — Z01818 Encounter for other preprocedural examination: Secondary | ICD-10-CM | POA: Insufficient documentation

## 2014-01-03 HISTORY — DX: Other specified health status: Z78.9

## 2014-01-03 LAB — TYPE AND SCREEN
ABO/RH(D): A POS
Antibody Screen: NEGATIVE

## 2014-01-03 LAB — CBC
HCT: 36.1 % (ref 36.0–46.0)
HEMOGLOBIN: 13.2 g/dL (ref 12.0–15.0)
MCH: 32.5 pg (ref 26.0–34.0)
MCHC: 36.6 g/dL — AB (ref 30.0–36.0)
MCV: 88.9 fL (ref 78.0–100.0)
PLATELETS: 138 10*3/uL — AB (ref 150–400)
RBC: 4.06 MIL/uL (ref 3.87–5.11)
RDW: 15 % (ref 11.5–15.5)
WBC: 5.7 10*3/uL (ref 4.0–10.5)

## 2014-01-03 LAB — ABO/RH: ABO/RH(D): A POS

## 2014-01-03 LAB — RPR: RPR: NONREACTIVE

## 2014-01-03 NOTE — Patient Instructions (Signed)
Glenda [name]  01/03/2014   Your procedure is scheduled on:  01/06/14  Enter through the Main Entrance of [facility] at [place] up the phone at the desk and dial [phone].   Call this number if you have problems the morning of surgery: [phone]   Remember:   Do not eat food:After Midnight.  Do not drink clear liquids: After Midnight.  Take these medicines the morning of surgery with A SIP OF WATER: NA   Do not wear jewelry, make-up or nail polish.  Do not wear lotions, powders, or perfumes. You may wear deodorant.  Do not shave 48 hours prior to surgery.  Do not bring valuables to the hospital.  [facility] is not   responsible for any belongings or valuables brought to the hospital.  Contacts, dentures or bridgework may not be worn into surgery.  Leave suitcase in the car. After surgery it may be brought to your room.  For patients admitted to the hospital, checkout time is 11:00 AM the day of              discharge.   Patients discharged the day of surgery will not be allowed to drive             home.  Name and phone number of your driver: NA  Special Instructions:   Shower using CHG 2 nights before surgery and the night before surgery.  If you shower the day of surgery use CHG.  Use special wash - you have one bottle of CHG for all showers.  You should use approximately 1/3 of the bottle for each shower.   Please read over the following fact sheets that you were given:   Surgical Site Infection Prevention

## 2014-01-03 NOTE — Pre-Procedure Instructions (Signed)
Platelet count of 138 called to Rudean Curt, MD, order given for repeat lab on day of surgery.

## 2014-01-05 MED ORDER — CEFAZOLIN SODIUM-DEXTROSE 2-3 GM-% IV SOLR
2.0000 g | INTRAVENOUS | Status: AC
  Administered 2014-01-06: 2 g via INTRAVENOUS

## 2014-01-05 MED ORDER — DEXTROSE 5 % IV SOLN
500.0000 mg | Freq: Once | INTRAVENOUS | Status: AC
  Administered 2014-01-06: 500 mg via INTRAVENOUS
  Filled 2014-01-05: qty 500

## 2014-01-06 ENCOUNTER — Inpatient Hospital Stay (HOSPITAL_COMMUNITY)
Admission: RE | Admit: 2014-01-06 | Discharge: 2014-01-09 | DRG: 765 | Disposition: A | Discharge status: Home / Self Care | Payer: Medicaid Other | Source: Ambulatory Visit | Attending: Obstetrics & Gynecology | Admitting: Obstetrics & Gynecology

## 2014-01-06 ENCOUNTER — Inpatient Hospital Stay (HOSPITAL_COMMUNITY): Payer: Medicaid Other | Admitting: Anesthesiology

## 2014-01-06 ENCOUNTER — Encounter (HOSPITAL_COMMUNITY)
Admission: RE | Disposition: A | Discharge status: Home / Self Care | Payer: Self-pay | Source: Ambulatory Visit | Attending: Obstetrics & Gynecology

## 2014-01-06 ENCOUNTER — Encounter (HOSPITAL_COMMUNITY): Payer: Self-pay | Admitting: *Deleted

## 2014-01-06 ENCOUNTER — Encounter (HOSPITAL_COMMUNITY): Payer: Medicaid Other | Admitting: Anesthesiology

## 2014-01-06 DIAGNOSIS — O34219 Maternal care for unspecified type scar from previous cesarean delivery: Principal | ICD-10-CM | POA: Diagnosis present

## 2014-01-06 DIAGNOSIS — B181 Chronic viral hepatitis B without delta-agent: Secondary | ICD-10-CM | POA: Diagnosis present

## 2014-01-06 DIAGNOSIS — Z302 Encounter for sterilization: Secondary | ICD-10-CM

## 2014-01-06 DIAGNOSIS — Z98891 History of uterine scar from previous surgery: Secondary | ICD-10-CM

## 2014-01-06 DIAGNOSIS — D582 Other hemoglobinopathies: Secondary | ICD-10-CM | POA: Diagnosis present

## 2014-01-06 DIAGNOSIS — O26619 Liver and biliary tract disorders in pregnancy, unspecified trimester: Secondary | ICD-10-CM | POA: Diagnosis present

## 2014-01-06 LAB — HEPATITIS B SURFACE ANTIGEN: Hepatitis B Surface Ag: POSITIVE — AB

## 2014-01-06 LAB — PLATELET COUNT: Platelets: 141 10*3/uL — ABNORMAL LOW (ref 150–400)

## 2014-01-06 SURGERY — Surgical Case
Anesthesia: Spinal | Laterality: Bilateral

## 2014-01-06 MED ORDER — BUPIVACAINE IN DEXTROSE 0.75-8.25 % IT SOLN
INTRATHECAL | Status: DC | PRN
  Administered 2014-01-06: 1.4 mL via INTRATHECAL

## 2014-01-06 MED ORDER — LACTATED RINGERS IV SOLN
INTRAVENOUS | Status: DC
  Administered 2014-01-06 (×3): via INTRAVENOUS

## 2014-01-06 MED ORDER — DIPHENHYDRAMINE HCL 50 MG/ML IJ SOLN: 12.5000 mg | INTRAMUSCULAR | Status: DC | PRN

## 2014-01-06 MED ORDER — NALBUPHINE HCL 10 MG/ML IJ SOLN
5.0000 mg | INTRAMUSCULAR | Status: DC | PRN
  Filled 2014-01-06: qty 1

## 2014-01-06 MED ORDER — NALOXONE HCL 1 MG/ML IJ SOLN: 1.0000 ug/kg/h | INTRAMUSCULAR | Status: DC | PRN

## 2014-01-06 MED ORDER — SIMETHICONE 80 MG PO CHEW: 80.0000 mg | CHEWABLE_TABLET | ORAL | Status: DC | PRN

## 2014-01-06 MED ORDER — SENNOSIDES-DOCUSATE SODIUM 8.6-50 MG PO TABS
2.0000 | ORAL_TABLET | ORAL | Status: DC
  Administered 2014-01-07 – 2014-01-08 (×3): 2 via ORAL
  Filled 2014-01-06 (×3): qty 2

## 2014-01-06 MED ORDER — FENTANYL CITRATE 0.05 MG/ML IJ SOLN
INTRAMUSCULAR | Status: DC | PRN
  Administered 2014-01-06: 12.5 ug via INTRATHECAL

## 2014-01-06 MED ORDER — LANOLIN HYDROUS EX OINT: 1.0000 "application " | TOPICAL_OINTMENT | CUTANEOUS | Status: DC | PRN

## 2014-01-06 MED ORDER — PHENYLEPHRINE 8 MG IN D5W 100 ML (0.08MG/ML) PREMIX OPTIME
INJECTION | INTRAVENOUS | Status: AC
  Filled 2014-01-06: qty 100

## 2014-01-06 MED ORDER — PHENYLEPHRINE 8 MG IN D5W 100 ML (0.08MG/ML) PREMIX OPTIME
INJECTION | INTRAVENOUS | Status: DC | PRN
  Administered 2014-01-06: 60 ug/min via INTRAVENOUS

## 2014-01-06 MED ORDER — METOCLOPRAMIDE HCL 5 MG/ML IJ SOLN: 10.0000 mg | Freq: Once | INTRAMUSCULAR | Status: DC | PRN

## 2014-01-06 MED ORDER — OXYTOCIN 10 UNIT/ML IJ SOLN
40.0000 [IU] | INTRAVENOUS | Status: DC | PRN
  Administered 2014-01-06: 40 [IU] via INTRAVENOUS

## 2014-01-06 MED ORDER — OXYTOCIN 10 UNIT/ML IJ SOLN
INTRAMUSCULAR | Status: AC
Start: 2014-01-06 — End: 2014-01-06
  Filled 2014-01-06: qty 4

## 2014-01-06 MED ORDER — METOCLOPRAMIDE HCL 5 MG/ML IJ SOLN: 10.0000 mg | Freq: Three times a day (TID) | INTRAMUSCULAR | Status: DC | PRN

## 2014-01-06 MED ORDER — SCOPOLAMINE 1 MG/3DAYS TD PT72
1.0000 | MEDICATED_PATCH | Freq: Once | TRANSDERMAL | Status: DC
  Administered 2014-01-06: 1.5 mg via TRANSDERMAL

## 2014-01-06 MED ORDER — ZOLPIDEM TARTRATE 5 MG PO TABS: 5.0000 mg | ORAL_TABLET | Freq: Every evening | ORAL | Status: DC | PRN

## 2014-01-06 MED ORDER — IBUPROFEN 600 MG PO TABS
600.0000 mg | ORAL_TABLET | Freq: Four times a day (QID) | ORAL | Status: DC
  Administered 2014-01-06 – 2014-01-09 (×12): 600 mg via ORAL
  Filled 2014-01-06 (×12): qty 1

## 2014-01-06 MED ORDER — WITCH HAZEL-GLYCERIN EX PADS: 1.0000 "application " | MEDICATED_PAD | CUTANEOUS | Status: DC | PRN

## 2014-01-06 MED ORDER — NALOXONE HCL 0.4 MG/ML IJ SOLN
0.4000 mg | INTRAMUSCULAR | Status: DC | PRN
Start: 2014-01-06 — End: 2014-01-06

## 2014-01-06 MED ORDER — MEPERIDINE HCL 25 MG/ML IJ SOLN: 6.2500 mg | INTRAMUSCULAR | Status: DC | PRN

## 2014-01-06 MED ORDER — MORPHINE SULFATE (PF) 0.5 MG/ML IJ SOLN
INTRAMUSCULAR | Status: DC | PRN
  Administered 2014-01-06: .2 mg via INTRATHECAL

## 2014-01-06 MED ORDER — DIPHENHYDRAMINE HCL 25 MG PO CAPS
25.0000 mg | ORAL_CAPSULE | ORAL | Status: DC | PRN
  Filled 2014-01-06: qty 1

## 2014-01-06 MED ORDER — KETOROLAC TROMETHAMINE 30 MG/ML IJ SOLN: 30.0000 mg | Freq: Four times a day (QID) | INTRAMUSCULAR | Status: DC | PRN

## 2014-01-06 MED ORDER — DIPHENHYDRAMINE HCL 50 MG/ML IJ SOLN: 25.0000 mg | INTRAMUSCULAR | Status: DC | PRN

## 2014-01-06 MED ORDER — TETANUS-DIPHTH-ACELL PERTUSSIS 5-2.5-18.5 LF-MCG/0.5 IM SUSP: 0.5000 mL | Freq: Once | INTRAMUSCULAR | Status: DC

## 2014-01-06 MED ORDER — MORPHINE SULFATE 0.5 MG/ML IJ SOLN
INTRAMUSCULAR | Status: AC
  Filled 2014-01-06: qty 10

## 2014-01-06 MED ORDER — MENTHOL 3 MG MT LOZG: 1.0000 | LOZENGE | OROMUCOSAL | Status: DC | PRN

## 2014-01-06 MED ORDER — CEFAZOLIN SODIUM-DEXTROSE 2-3 GM-% IV SOLR
INTRAVENOUS | Status: AC
  Filled 2014-01-06: qty 50

## 2014-01-06 MED ORDER — LACTATED RINGERS IV SOLN
INTRAVENOUS | Status: DC
  Administered 2014-01-06: 17:00:00 via INTRAVENOUS

## 2014-01-06 MED ORDER — SIMETHICONE 80 MG PO CHEW
80.0000 mg | CHEWABLE_TABLET | ORAL | Status: DC
  Administered 2014-01-07 – 2014-01-08 (×2): 80 mg via ORAL
  Filled 2014-01-06 (×3): qty 1

## 2014-01-06 MED ORDER — FENTANYL CITRATE 0.05 MG/ML IJ SOLN
INTRAMUSCULAR | Status: AC
  Filled 2014-01-06: qty 2

## 2014-01-06 MED ORDER — ONDANSETRON HCL 4 MG PO TABS: 4.0000 mg | ORAL_TABLET | ORAL | Status: DC | PRN

## 2014-01-06 MED ORDER — PRENATAL MULTIVITAMIN CH
1.0000 | ORAL_TABLET | Freq: Every day | ORAL | Status: DC
  Administered 2014-01-07 – 2014-01-09 (×3): 1 via ORAL
  Filled 2014-01-06 (×3): qty 1

## 2014-01-06 MED ORDER — FENTANYL CITRATE 0.05 MG/ML IJ SOLN: 25.0000 ug | INTRAMUSCULAR | Status: DC | PRN

## 2014-01-06 MED ORDER — DIPHENHYDRAMINE HCL 25 MG PO CAPS: 25.0000 mg | ORAL_CAPSULE | Freq: Four times a day (QID) | ORAL | Status: DC | PRN

## 2014-01-06 MED ORDER — KETOROLAC TROMETHAMINE 30 MG/ML IJ SOLN
30.0000 mg | Freq: Four times a day (QID) | INTRAMUSCULAR | Status: DC | PRN
  Administered 2014-01-06: 30 mg via INTRAMUSCULAR

## 2014-01-06 MED ORDER — SCOPOLAMINE 1 MG/3DAYS TD PT72: 1.0000 | MEDICATED_PATCH | Freq: Once | TRANSDERMAL | Status: DC

## 2014-01-06 MED ORDER — ONDANSETRON HCL 4 MG/2ML IJ SOLN
INTRAMUSCULAR | Status: DC | PRN
  Administered 2014-01-06: 4 mg via INTRAVENOUS

## 2014-01-06 MED ORDER — OXYTOCIN 40 UNITS IN LACTATED RINGERS INFUSION - SIMPLE MED: 62.5000 mL/h | INTRAVENOUS | Status: AC

## 2014-01-06 MED ORDER — SODIUM CHLORIDE 0.9 % IJ SOLN: 3.0000 mL | INTRAMUSCULAR | Status: DC | PRN

## 2014-01-06 MED ORDER — OXYCODONE-ACETAMINOPHEN 5-325 MG PO TABS
1.0000 | ORAL_TABLET | ORAL | Status: DC | PRN
  Administered 2014-01-07 – 2014-01-08 (×5): 1 via ORAL
  Administered 2014-01-08: 2 via ORAL
  Administered 2014-01-09 (×3): 1 via ORAL
  Filled 2014-01-06 (×2): qty 1
  Filled 2014-01-06: qty 2
  Filled 2014-01-06: qty 1
  Filled 2014-01-06: qty 2
  Filled 2014-01-06 (×4): qty 1

## 2014-01-06 MED ORDER — KETOROLAC TROMETHAMINE 30 MG/ML IJ SOLN
INTRAMUSCULAR | Status: AC
  Filled 2014-01-06: qty 1

## 2014-01-06 MED ORDER — SCOPOLAMINE 1 MG/3DAYS TD PT72
MEDICATED_PATCH | TRANSDERMAL | Status: AC
  Administered 2014-01-06: 1.5 mg via TRANSDERMAL
  Filled 2014-01-06: qty 1

## 2014-01-06 MED ORDER — SIMETHICONE 80 MG PO CHEW
80.0000 mg | CHEWABLE_TABLET | Freq: Three times a day (TID) | ORAL | Status: DC
  Administered 2014-01-06 – 2014-01-09 (×9): 80 mg via ORAL
  Filled 2014-01-06 (×8): qty 1

## 2014-01-06 MED ORDER — ONDANSETRON HCL 4 MG/2ML IJ SOLN
INTRAMUSCULAR | Status: AC
  Filled 2014-01-06: qty 2

## 2014-01-06 MED ORDER — ONDANSETRON HCL 4 MG/2ML IJ SOLN: 4.0000 mg | Freq: Three times a day (TID) | INTRAMUSCULAR | Status: DC | PRN

## 2014-01-06 MED ORDER — DIBUCAINE 1 % RE OINT: 1.0000 "application " | TOPICAL_OINTMENT | RECTAL | Status: DC | PRN

## 2014-01-06 MED ORDER — ONDANSETRON HCL 4 MG/2ML IJ SOLN: 4.0000 mg | INTRAMUSCULAR | Status: DC | PRN

## 2014-01-06 SURGICAL SUPPLY — 41 items
BENZOIN TINCTURE PRP APPL 2/3 (GAUZE/BANDAGES/DRESSINGS) ×2 IMPLANT
CANISTER WOUND CARE 500ML ATS (WOUND CARE) IMPLANT
CLAMP CORD UMBIL (MISCELLANEOUS) IMPLANT
CLOTH BEACON ORANGE TIMEOUT ST (SAFETY) ×2 IMPLANT
CONTAINER PREFILL 10% NBF 15ML (MISCELLANEOUS) IMPLANT
DRAPE LG THREE QUARTER DISP (DRAPES) IMPLANT
DRSG OPSITE POSTOP 4X10 (GAUZE/BANDAGES/DRESSINGS) ×2 IMPLANT
DRSG VAC ATS LRG SENSATRAC (GAUZE/BANDAGES/DRESSINGS) IMPLANT
DRSG VAC ATS MED SENSATRAC (GAUZE/BANDAGES/DRESSINGS) IMPLANT
DRSG VAC ATS SM SENSATRAC (GAUZE/BANDAGES/DRESSINGS) IMPLANT
DURAPREP 26ML APPLICATOR (WOUND CARE) ×2 IMPLANT
ELECT REM PT RETURN 9FT ADLT (ELECTROSURGICAL) ×2
ELECTRODE REM PT RTRN 9FT ADLT (ELECTROSURGICAL) ×1 IMPLANT
EXTRACTOR VACUUM M CUP 4 TUBE (SUCTIONS) IMPLANT
GLOVE BIO SURGEON STRL SZ 6.5 (GLOVE) ×2 IMPLANT
GOWN PREVENTION PLUS XLARGE (GOWN DISPOSABLE) ×4 IMPLANT
GOWN STRL REIN XL XLG (GOWN DISPOSABLE) ×4 IMPLANT
KIT ABG SYR 3ML LUER SLIP (SYRINGE) IMPLANT
NEEDLE HYPO 25X5/8 SAFETYGLIDE (NEEDLE) ×2 IMPLANT
NS IRRIG 1000ML POUR BTL (IV SOLUTION) ×2 IMPLANT
PACK C SECTION WH (CUSTOM PROCEDURE TRAY) ×2 IMPLANT
PAD OB MATERNITY 4.3X12.25 (PERSONAL CARE ITEMS) ×2 IMPLANT
RTRCTR C-SECT PINK 25CM LRG (MISCELLANEOUS) IMPLANT
SCRUB PCMX 4 OZ (MISCELLANEOUS) ×2 IMPLANT
STAPLER VISISTAT 35W (STAPLE) IMPLANT
STRIP CLOSURE SKIN 1/2X4 (GAUZE/BANDAGES/DRESSINGS) ×2 IMPLANT
SUT MNCRL 0 VIOLET CTX 36 (SUTURE) ×2 IMPLANT
SUT MNCRL AB 3-0 PS2 27 (SUTURE) ×2 IMPLANT
SUT MON AB 2-0 CT1 27 (SUTURE) IMPLANT
SUT MONOCRYL 0 CTX 36 (SUTURE) ×2
SUT PDS AB 0 CTX 36 PDP370T (SUTURE) ×4 IMPLANT
SUT PLAIN 0 NONE (SUTURE) IMPLANT
SUT VIC AB 0 CTXB 36 (SUTURE) IMPLANT
SUT VIC AB 2-0 CT1 (SUTURE) ×2 IMPLANT
SUT VIC AB 2-0 CT1 27 (SUTURE) ×2
SUT VIC AB 2-0 CT1 TAPERPNT 27 (SUTURE) ×1 IMPLANT
SUT VIC AB 2-0 SH 27 (SUTURE) ×4
SUT VIC AB 2-0 SH 27XBRD (SUTURE) ×4 IMPLANT
TOWEL OR 17X24 6PK STRL BLUE (TOWEL DISPOSABLE) ×2 IMPLANT
TRAY FOLEY CATH 14FR (SET/KITS/TRAYS/PACK) ×2 IMPLANT
WATER STERILE IRR 1000ML POUR (IV SOLUTION) ×2 IMPLANT

## 2014-01-06 NOTE — Anesthesia Procedure Notes (Signed)
Spinal  Patient location during procedure: OR Start time: 01/06/2014 8:05 AM End time: 01/06/2014 8:07 AM Staffing Anesthesiologist: Lyn Hollingshead Performed by: anesthesiologist  Preanesthetic Checklist Completed: patient identified, surgical consent, pre-op evaluation, timeout performed, IV checked, risks and benefits discussed and monitors and equipment checked Spinal Block Patient position: sitting Prep: DuraPrep Patient monitoring: heart rate, cardiac monitor, continuous pulse ox and blood pressure Approach: midline Location: L3-4 Injection technique: single-shot Needle Needle type: Sprotte  Needle gauge: 24 G Needle length: 9 cm Needle insertion depth: 4 cm Assessment Sensory level: T4

## 2014-01-06 NOTE — Consult Note (Signed)
Neonatology Note:   Attendance at C-section:    I was asked by Dr. Delsa Sale to attend this repeat C/S at term. 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 ROM at delivery, fluid clear. Infant vigorous with good spontaneous cry and tone. Needed only minimal bulb suctioning. Ap 9/9. Lungs clear to ausc in DR. To CN to care of Pediatrician.   Real Cons, MD

## 2014-01-06 NOTE — Op Note (Signed)
Cesarean Section Procedure Note   Glenda [name]   01/06/2014  Indications: Intrauterine pregnancy at [redacted]w[redacted]d, history of previous cesarean deliveries x 2, desires a sterilization procedure  Pre-operative Diagnosis:  Intrauterine pregnancy at [redacted]w[redacted]d, history of previous cesarean deliveries x 2, desires a sterilization procedure  Post-operative Diagnosis: Same   Surgeon: Lahoma Crocker A  Assistants: Baltazar Najjar A  Anesthesia: spinal  Procedure Details:  The patient was seen in the Holding Room. The risks, benefits, complications, treatment options, and expected outcomes were discussed with the patient. The patient concurred with the proposed plan, giving informed consent. The patient was identified as Glenda [name] and the procedure verified as C-Section Delivery. A Time Out was held and the above information confirmed.  After induction of anesthesia, the patient was draped and prepped in the usual sterile manner. A transverse incision was made and carried down through the subcutaneous tissue to the fascia. The fascial incision was made and extended transversely. The fascia was separated from the underlying rectus tissue superiorly. The peritoneum was identified and entered. The peritoneal incision was extended longitudinally. . A low transverse uterine incision was made. Delivered from cephalic presentation was a  living newborn female infant. APGAR (1 MIN): 9   APGAR (5 MINS): 9    A cord ph was not sent. The umbilical cord was clamped and cut cord. A sample was obtained for evaluation. The placenta was removed Intact and appeared normal.  The uterine incision was closed with running locked sutures of 1-0 Monocryl.  Hemostasis was observed.  The left mesosalpinx was serially clamped and cut.  The proximal tube was clamped and cut and the fallopian tube removed.  The pedicles were secured with suture ligatures of 2-0 Vicryl.  The right fallopian tube was excised in a similar fashion.   The parieto peritoneum was closed in a running fashion with 2-0 Vicryl.  The fascia was then reapproximated with running sutures of 0 PDS.  The skin was closed with suture.  Instrument, sponge, and needle counts were correct prior the abdominal closure and were correct at the conclusion of the case.    Findings:  See above.  Normal tubes and ovaries   Estimated Blood Loss: 600 ml  Total IV Fluids: per Anesthesiology  Urine Output: per Anesthesiology  Specimens: Placenta  Complications: no complications  Disposition: PACU - hemodynamically stable.  Maternal Condition: stable   Baby condition / location:  Couplet care / Skin to Skin    Signed: Surgeon(s): Lahoma Crocker, MD

## 2014-01-06 NOTE — Anesthesia Postprocedure Evaluation (Signed)
  Anesthesia Post-op Note  Patient: Glenda [name]  Procedure(s) Performed: Procedure(s): CESAREAN SECTION WITH BILATERAL TUBAL LIGATION (Bilateral)  Patient Location: Mother/Baby  Anesthesia Type:Spinal  Level of Consciousness: awake  Airway and Oxygen Therapy: Patient Spontanous Breathing  Post-op Pain: mild  Post-op Assessment: Patient's Cardiovascular Status Stable and Respiratory Function Stable  Post-op Vital Signs: stable  Complications: No apparent anesthesia complications

## 2014-01-06 NOTE — Lactation Note (Signed)
This note was copied from the chart of [place]. Lactation Consultation Note Initial visit at 8 hours of age.  [facility] LC resources given and discussed.  Mom is experienced with breast feeding 2 older children for a few months each.  Mom reports breastfeeding is going well now and is concerned she is "Dry" meaning no milk.  Hand expression demonstrated with small drop noted after repeated attempts. Encouraged mom to feed with cues skin to skin, and continue hand expression.  Attempted latch baby took a few sucks and back to sleep.  Discussed positioning baby for deep latch in cross cradle.  Discussed daily out put chart in baby and me booklet. Baby has had 3 feedings and a wet diaper already.  Mom to call for assist as needed.   Patient Name: Glenda [name] POEUM'P Date: 01/06/2014 Reason for consult: Initial assessment   Maternal Data Formula Feeding for Exclusion: No  Feeding Feeding Type: Breast Fed Length of feed:  (few sucks)  LATCH Score/Interventions Latch: Grasps breast easily, tongue down, lips flanged, rhythmical sucking. Intervention(s): Adjust position;Assist with latch;Breast massage;Breast compression  Audible Swallowing: None Intervention(s): Skin to skin;Hand expression  Type of Nipple: Everted at rest and after stimulation  Comfort (Breast/Nipple): Soft / non-tender     Hold (Positioning): No assistance needed to correctly position infant at breast.  LATCH Score: 8  Lactation Tools Discussed/Used     Consult Status Consult Status: Follow-up Date: 01/07/14 Follow-up type: In-patient    Justice Britain 01/06/2014, 7:04 PM

## 2014-01-06 NOTE — Transfer of Care (Signed)
Immediate Anesthesia Transfer of Care Note  Patient: Glenda [name]  Procedure(s) Performed: Procedure(s): CESAREAN SECTION WITH BILATERAL TUBAL LIGATION (Bilateral)  Patient Location: PACU  Anesthesia Type:Spinal  Level of Consciousness: awake, alert  and oriented  Airway & Oxygen Therapy: Patient Spontanous Breathing  Post-op Assessment: Report given to PACU RN and Post -op Vital signs reviewed and stable  Post vital signs: Reviewed and stable  Complications: No apparent anesthesia complications

## 2014-01-06 NOTE — Anesthesia Postprocedure Evaluation (Signed)
  Anesthesia Post Note  Patient: Glenda [name]  Procedure(s) Performed: Procedure(s) (LRB): CESAREAN SECTION WITH BILATERAL TUBAL LIGATION (Bilateral)  Anesthesia type: Spinal  Patient location: PACU  Post pain: Pain level controlled  Post assessment: Post-op Vital signs reviewed  Last Vitals:  Filed Vitals:   01/06/14 1030  BP: 122/71  Pulse: 65  Temp:   Resp: 15    Post vital signs: Reviewed  Level of consciousness: awake  Complications: No apparent anesthesia complications

## 2014-01-06 NOTE — Progress Notes (Signed)
UR chart review completed.  

## 2014-01-06 NOTE — Anesthesia Preprocedure Evaluation (Signed)
Anesthesia Evaluation  Patient identified by MRN, date of birth, ID band Patient awake    Reviewed: Allergy & Precautions, H&P , NPO status , Patient's Chart, lab work & pertinent test results, reviewed documented beta blocker date and time   History of Anesthesia Complications Negative for: history of anesthetic complications  Airway Mallampati: III TM Distance: >3 FB Neck ROM: full    Dental  (+) Teeth Intact   Pulmonary neg pulmonary ROS,  breath sounds clear to auscultation        Cardiovascular negative cardio ROS  Rhythm:regular Rate:Normal     Neuro/Psych negative neurological ROS  negative psych ROS   GI/Hepatic Neg liver ROS, GERD- (tums prn)  ,  Endo/Other  negative endocrine ROS  Renal/GU negative Renal ROS  negative genitourinary   Musculoskeletal   Abdominal   Peds  Hematology Thrombocytopenia - plt 141   Anesthesia Other Findings   Reproductive/Obstetrics (+) Pregnancy (h/o c/s x2 for repeat C/S and b/l salpingectomy)                           Anesthesia Physical Anesthesia Plan  ASA: II  Anesthesia Plan: Spinal   Post-op Pain Management:    Induction:   Airway Management Planned:   Additional Equipment:   Intra-op Plan:   Post-operative Plan:   Informed Consent: I have reviewed the patients History and Physical, chart, labs and discussed the procedure including the risks, benefits and alternatives for the proposed anesthesia with the patient or authorized representative who has indicated his/her understanding and acceptance.     Plan Discussed with: Surgeon and CRNA  Anesthesia Plan Comments:         Anesthesia Quick Evaluation

## 2014-01-06 NOTE — H&P (Signed)
Glenda [name] is a 28 y.o. female presenting for an elective C/D. Maternal Medical History:  Reason for admission: She presents for an elective C/D/sterilization.  Fetal activity: Perceived fetal activity is normal.    Prenatal complications: no prenatal complications Prenatal Complications - Diabetes: none.    OB History   Grav Para Term Preterm Abortions TAB SAB Ect Mult Living   3 2 2       2      Past Medical History  Diagnosis Date  . Medical history non-contributory    Past Surgical History  Procedure Laterality Date  . Cesarean section      x 2    Family History: family history includes Diabetes in her maternal grandmother. Social History:  reports that she has never smoked. She has never used smokeless tobacco. She reports that she does not drink alcohol or use illicit drugs.   Prenatal Transfer Tool  Maternal Diabetes: No Maternal Ultrasounds/Referrals: Normal Fetal Ultrasounds or other Referrals:  None Maternal Substance Abuse:  No Significant Maternal Medications:  None Significant Maternal Lab Results:  None Other Comments:  None  Review of Systems  Constitutional: Negative for fever.  Eyes: Negative for blurred vision.  Respiratory: Negative for shortness of breath.   Gastrointestinal: Negative for vomiting.  Skin: Negative for rash.  Neurological: Negative for headaches.      Blood pressure 133/71, pulse 88, temperature 97.3 F (36.3 C), temperature source Oral, resp. rate 20, last menstrual period 04/06/2013, SpO2 98.00%. Maternal Exam:  Abdomen: Patient reports no abdominal tenderness. Fetal presentation: vertex  Cervix: not evaluated.   Fetal Exam Fetal Monitor Review: Mode: hand-held doppler probe.       Physical Exam  Constitutional: She appears well-developed.  HENT:  Head: Normocephalic.  Neck: Neck supple. No thyromegaly present.  Cardiovascular: Normal rate and regular rhythm.   Respiratory: Breath sounds normal.  GI:  Soft. Bowel sounds are normal.  Skin: No rash noted.    Prenatal labs: ABO, Rh: --/--/A POS, A POS (01/09 1055) Antibody: NEG (01/09 1055) Rubella: 25.60 (07/31 0959) RPR: NON REACTIVE (01/09 1055)  HBsAg: SEE NOTE (07/31 0959)  HIV: NON REACTIVE (10/09 1047)  GBS: NEGATIVE (12/18 1602)   Assessment/Plan: Multipara @ [redacted]w[redacted]d for an elective repeat C/D/B/L salpingectomies  Admit Repeat C/D   JACKSON-MOORE,Weldon Nouri A 01/06/2014, 7:49 AM

## 2014-01-07 ENCOUNTER — Encounter (HOSPITAL_COMMUNITY): Payer: Self-pay | Admitting: *Deleted

## 2014-01-07 LAB — CBC
HCT: 34.2 % — ABNORMAL LOW (ref 36.0–46.0)
Hemoglobin: 12.3 g/dL (ref 12.0–15.0)
MCH: 31.9 pg (ref 26.0–34.0)
MCHC: 36 g/dL (ref 30.0–36.0)
MCV: 88.6 fL (ref 78.0–100.0)
PLATELETS: 135 10*3/uL — AB (ref 150–400)
RBC: 3.86 MIL/uL — ABNORMAL LOW (ref 3.87–5.11)
RDW: 15 % (ref 11.5–15.5)
WBC: 7.9 10*3/uL (ref 4.0–10.5)

## 2014-01-07 LAB — COMPREHENSIVE METABOLIC PANEL
ALBUMIN: 2.2 g/dL — AB (ref 3.5–5.2)
ALT: 61 U/L — AB (ref 0–35)
AST: 56 U/L — AB (ref 0–37)
Alkaline Phosphatase: 242 U/L — ABNORMAL HIGH (ref 39–117)
BILIRUBIN TOTAL: 0.5 mg/dL (ref 0.3–1.2)
BUN: 9 mg/dL (ref 6–23)
CHLORIDE: 102 meq/L (ref 96–112)
CO2: 26 mEq/L (ref 19–32)
Calcium: 9.2 mg/dL (ref 8.4–10.5)
Creatinine, Ser: 0.76 mg/dL (ref 0.50–1.10)
GFR calc Af Amer: >90 mL/min (ref >90)
GFR calc non Af Amer: >90 mL/min (ref >90)
Glucose, Bld: 75 mg/dL (ref 70–99)
POTASSIUM: 4.3 meq/L (ref 3.7–5.3)
Sodium: 136 mEq/L — ABNORMAL LOW (ref 137–147)
Total Protein: 5.3 g/dL — ABNORMAL LOW (ref 6.0–8.3)

## 2014-01-07 NOTE — Progress Notes (Signed)
Subjective: Postpartum Day 1: Cesarean Delivery Patient reports no problems voiding.   Witnessed patient ambulate, void independently. Objective: Vital signs in last 24 hours: Temp:  [98 F (36.7 C)-99 F (37.2 C)] 98 F (36.7 C) (01/13 1800) Pulse Rate:  [69-80] 80 (01/13 1800) Resp:  [18] 18 (01/13 1800) BP: (100-108)/(63-73) 108/73 mmHg (01/13 1800) SpO2:  [96 %-97 %] 96 % (01/13 0815)  Physical Exam:  General: alert and cooperative Lochia: appropriate Uterine Fundus: firm Incision: healing well DVT Evaluation: No evidence of DVT seen on physical exam.   Recent Labs  01/07/14 0550  HGB 12.3  HCT 34.2*    Assessment/Plan: Status post Cesarean section. Doing well postoperatively.  Continue current care.  Celina Shiley 01/07/2014, 6:41 PM

## 2014-01-07 NOTE — Lactation Note (Signed)
This note was copied from the chart of [place]. Lactation Consultation Note  Patient Name: Glenda [name] [id number]'[id number] Date: 01/07/2014 Reason for consult: Follow-up assessment of this multipara and her newborn, now 85 hours of age but sleepy in her arms.  Mom reports that baby has only nursed 3 times today.  Baby's output wnl and LATCH score is consistently=9/  LC recommended trying football position after removing shirt and placing baby STS and he is able to latch quickly to (R) breast with frequent swallows noted almost immediately.  LC observed first 5 minutes and encouraged mom to stimulate baby as needed to ensure sustained latch. LC  reviewed benefits of STS, waking techniques, normal newborn breastfeeding pattern and output.   Maternal Data    Feeding    LATCH Score/Interventions Latch: Grasps breast easily, tongue down, lips flanged, rhythmical sucking. (baby asleep/dressed but awakens quickly and latches well) Intervention(s): Assist with latch  Audible Swallowing: Spontaneous and intermittent Intervention(s): Skin to skin Intervention(s): Skin to skin  Type of Nipple: Everted at rest and after stimulation  Comfort (Breast/Nipple): Soft / non-tender     Hold (Positioning): Assistance needed to correctly position infant at breast and maintain latch. Intervention(s): Breastfeeding basics reviewed;Support Pillows;Position options;Skin to skin (recommended football position for sleepy baby)  LATCH Score: 9 (with brief assistance by [facility])  Lactation Tools Discussed/Used    STS, waking techniques Normal feeding frequency and cue feeding Consult Status Consult Status: Follow-up Date: 01/08/14 Follow-up type: In-patient    Junious Dresser [facility] 01/07/2014, 7:51 PM

## 2014-01-08 ENCOUNTER — Inpatient Hospital Stay (HOSPITAL_COMMUNITY): Admission: AD | Admit: 2014-01-08 | Payer: Self-pay | Source: Ambulatory Visit | Admitting: Obstetrics

## 2014-01-08 LAB — HEPATITIS B DNA, ULTRAQUANTITATIVE, PCR
Hepatitis B DNA (Calc): 24914134 copies/mL — ABNORMAL HIGH (ref <116)
Hepatitis B DNA: 4280779 IU/mL — ABNORMAL HIGH (ref <20)

## 2014-01-08 NOTE — Progress Notes (Signed)
Subjective: Postpartum Day 2: Cesarean Delivery Patient reports tolerating PO, + flatus and no problems voiding.    Objective: Vital signs in last 24 hours: Temp:  [98 F (36.7 C)-98.9 F (37.2 C)] 98.9 F (37.2 C) (01/14 0600) Pulse Rate:  [78-80] 78 (01/14 0600) Resp:  [18] 18 (01/14 0600) BP: (108)/(70-73) 108/70 mmHg (01/14 0600)  Physical Exam:  General: alert and no distress Lochia: appropriate Uterine Fundus: firm Incision: healing well DVT Evaluation: No evidence of DVT seen on physical exam.   Recent Labs  01/07/14 0550  HGB 12.3  HCT 34.2*    Assessment/Plan: Status post Cesarean section. Doing well postoperatively.  Continue current care.  HARPER,CHARLES A 01/08/2014, 8:25 AM

## 2014-01-09 MED ORDER — OXYCODONE-ACETAMINOPHEN 5-325 MG PO TABS: 1.0000 | ORAL_TABLET | ORAL | Status: DC | PRN

## 2014-01-09 NOTE — Discharge Summary (Signed)
  Obstetric Discharge Summary Reason for Admission: cesarean section Prenatal Procedures: none Intrapartum Procedures: cesarean: low cervical, transverse and bilateral salpingectomies Postpartum Procedures: none Complications-Operative and Postpartum: none  Hemoglobin  Date Value Range Status  01/07/2014 12.3  12.0 - 15.0 g/dL Final     HCT  Date Value Range Status  01/07/2014 34.2* 36.0 - 46.0 % Final    Physical Exam:  General: alert Lochia: appropriate Uterine: firm Incision: clean, dry and intact DVT Evaluation: No evidence of DVT seen on physical exam.  Discharge Diagnoses: Active Problems:   Chronic type B viral hepatitis   Hemoglobin C trait   S/P cesarean section   Discharge Information: Date: 01/09/2014 Activity: pelvic rest Diet: routine Medications:  Prior to Admission medications   Medication Sig Start Date End Date Taking? Authorizing Provider  calcium carbonate (TUMS - DOSED IN MG ELEMENTAL CALCIUM) 500 MG chewable tablet Chew 1 tablet by mouth daily.   Yes Historical Provider, MD  Prenatal Vit-Fe Fumarate-FA (VITAFOL-OB) TABS Take 1 tablet by mouth daily before breakfast. 07/25/13  Yes Glenda Bombard, MD  oxyCODONE-acetaminophen (PERCOCET/ROXICET) 5-325 MG per tablet Take 1-2 tablets by mouth every 4 (four) hours as needed for severe pain (moderate - severe pain). 01/09/14   Glenda Crocker, MD    Condition: stable Instructions: refer to routine discharge instructions Discharge to: home Follow-up Information   Follow up with Glenda [name] A, MD. Schedule an appointment as soon as possible for [name] visit in 2 weeks.   Specialty:  Obstetrics and Gynecology   Contact information:   [address] [place] [address] [phone]       Newborn Data:  Live born female  Birth Weight: 6 lb 15 oz (3147 g) APGAR: 9, 9   Home with mother.  Glenda [name],Glenda [name] 01/09/2014, 8:51 AM

## 2014-01-09 NOTE — Discharge Instructions (Signed)
Cesarean Section (Postpartum Care) °These discharge instructions provide you with general information on cesarean section (caesarean, cesarian, caesarian) and caring for yourself after you leave the hospital. Your caregiver may also give you specific instructions. °Please read these instructions and refer to them in the next few weeks. If you have any questions regarding these instructions, you may call the nursing unit. If you have any problems after discharge, please call your doctor. If you are unable to reach your doctor, you should seek help at the nearest Emergency Department. °ACTIVITY °· Rest as much as possible the first two weeks at home.  °· When possible, have someone help you with your household activities and the new baby for 2 to 3 weeks.  °· Limit your housework and social activity. Increase your activity gradually as your strength returns.  °· Do not climb stairs more than two or three times a day.  °· Do not lift anything heavier than your baby.  °· Follow your doctor's instructions about driving a car.  °· Limit wearing support panties or control-top hose, since relying on your own muscles helps strengthen them.  °· Ask your doctor about exercises.  °NUTRITION °· You may return to your usual diet.  °· Drink 6 to 8 glasses of fluid a day.  °· Eat a well-balanced diet. Include portions of food from the meat/protein, milk, fruit, vegetable and bread groups.  °· Keep taking your prenatal or multivitamins.  °ELIMINATION °You should return to your usual bowel function. If constipation is a problem, you may take a mild laxative such as Milk of MagnesiaÔ with your caregiver’s permission. Gradually add fruit, vegetables and bran to your diet. Make sure to increase your fluids. °HYGIENE °You may shower, wash your hair and take tub baths unless your doctor tells you otherwise. Continue peri-care until your vaginal bleeding and discharge stops. Do not douche or use tampons until your caregiver says it is  OK. °FEVER °If you feel feverish or have shaking chills, take your temperature. If your temperature is 101° F (38.3° C), or is 100.4° F (38° C) two times in a four hour period, call your doctor. The fever may indicate infection. If you call early, infection can be treated with medicines that kill germs (antibiotics). Hospitalization may be avoided. °PAIN CONTROL °You may still have mild discomfort. Only take over-the-counter or prescription medicine as directed by your caregiver. Do not take aspirin; it can cause bleeding. If the pain is not relieved by your medicine or becomes worse, call your caregiver. °INCISION CARE °Clean your cut (incision) gently with soap and water. If your caregiver says it is okay, leave the incision without a dressing unless it is draining or irritated. If you have small adhesive strips across the incision and they do not fall off within 7 days, carefully peel them off. Check the incision daily for increased redness, drainage, swelling or separation of skin. Call your caregiver if any of these happen. °VAGINAL CARE °You may have a vaginal discharge or bleeding for up to 6 weeks. If the vaginal discharge becomes bright red, bad smelling, heavy in amount, has blood clots or if you have burning or frequency when urinating, call your caregiver. °SEXUAL INTERCOURSE °It is best to follow your caregiver's advice about when you may safely resume sexual intercourse. Most women can begin to have intercourse two to three weeks after their baby's birth. °You can become pregnant before you have a period. If you decide to have sexual intercourse, you should use   birth control if you do not want to become pregnant right away. ° °BREAST CARE °If you are not breastfeeding and your breasts become tender, hard or leak milk, you may wear a firm fitting bra and apply ice to the breasts. If you are breastfeeding, wear a good support bra. Call your caregiver if you have breast pain, flu-like symptoms, fever or  hardness and reddening of your breasts. °POSTPARTUM BLUES °After the excitement of having the baby goes away, you may commonly have a period of low spirits or “blues." Discuss your feelings with your partner, family and friends. This may be caused by the changing hormone levels in your body. You may want to contact your caregiver if this is worrisome. °SEEK MEDICAL CARE IF: °· There is swelling, redness or increasing pain in the wound area.  °· Pus is coming from the wound.  °· You notice a bad smell from the wound or surgical dressing.  °· You have pain, redness and swelling from the intravenous site.  °· The wound is breaking open (the edges are not staying together).  °· You feel dizzy or feel like fainting.  °· You develop pain or bleeding when you urinate.  °· You develop diarrhea.  °· You develop nausea and vomiting.  °· You develop abnormal vaginal discharge.  °· You develop a rash.  °· You have any type of abnormal reaction or develop an allergy to your medication.  °· You need stronger pain medication for your pain.  °SEEK IMMEDIATE MEDICAL CARE: °· You develop a temperature of 101 or higher.  °· You develop abdominal pain.  °· You develop chest pain.  °· You develop shortness of breath.  °· You pass out.  °· You develop pain, swelling or redness of your leg.  °· You develop heavy vaginal bleeding with or without blood clots.  °Document Released: 09/03/2002 Document Re-Released: 06/01/2010 °[employer]® Patient Information ©2011 [employer]. °

## 2014-01-09 NOTE — Lactation Note (Addendum)
This note was copied from the chart of [place]. Lactation Consultation Note  Patient Name: Boy Senya Hinzman DVVOH'Y Date: 01/09/2014 Reason for consult: Follow-up assessment Mom ready to go home, her breasts are engorged. Baby BF per Mom for 35 minutes last feeding on the left breast. Per Mom breasts has softened but nodule palpable by LC. Right breast is firm, Mom iced her breast, then pumped and received approx 45 ml of EBM. Reviewed engorgement care with Mom, written instructions given. Advised not to miss any feedings, be sure baby is at the breast every 2-3 hours, pre-pump to help with latch, post pump to comfort, apply ice packs. S/S of infection reviewed with Mom. Mom to ice and post pump right breast before d/c home. Advised of OP services and support group. Mom is experienced BF and reports baby is latching well.   Maternal Data    Feeding Feeding Type: Breast Fed Length of feed: 15 min  LATCH Score/Interventions Latch: Grasps breast easily, tongue down, lips flanged, rhythmical sucking. Intervention(s): Breast compression;Breast massage;Assist with latch  Audible Swallowing: Spontaneous and intermittent  Type of Nipple: Everted at rest and after stimulation  Comfort (Breast/Nipple): Engorged, cracked, bleeding, large blisters, severe discomfort Problem noted: Engorgment Intervention(s): Ice;Hand expression  Problem noted: Filling;Mild/Moderate discomfort Interventions (Filling): Frequent nursing;Firm support;Massage;Hand pump  Hold (Positioning): No assistance needed to correctly position infant at breast. Intervention(s): Breastfeeding basics reviewed  LATCH Score: 8  Lactation Tools Discussed/Used Tools: Pump Breast pump type: Manual   Consult Status Consult Status: Complete Date: 01/09/14 Follow-up type: In-patient    Katrine Coho 01/09/2014, 12:48 PM

## 2014-01-15 ENCOUNTER — Ambulatory Visit: Payer: Medicaid Other | Admitting: Obstetrics & Gynecology

## 2014-01-22 ENCOUNTER — Encounter: Payer: Self-pay | Admitting: Obstetrics & Gynecology

## 2014-01-22 ENCOUNTER — Ambulatory Visit (INDEPENDENT_AMBULATORY_CARE_PROVIDER_SITE_OTHER): Payer: Medicaid Other | Admitting: Obstetrics & Gynecology

## 2014-01-22 ENCOUNTER — Ambulatory Visit: Payer: Medicaid Other | Admitting: Obstetrics & Gynecology

## 2014-01-22 NOTE — Progress Notes (Signed)
Subjective:     Glenda [name] is a 28 y.o. female who presents for a postpartum visit. She is 2 weeks postpartum following a low cervical transverse Cesarean section. I have fully reviewed the prenatal and intrapartum course. The delivery was at 7 gestational weeks. Outcome: repeat cesarean section, low transverse incision. Anesthesia: spinal. Postpartum course has been WNL. Baby's course has been WNL. Baby is feeding by breast. Bleeding staining only. Bowel function is normal. Bladder function is normal. Patient is not sexually active. Contraception method is abstinence . Postpartum depression screening: negative.  The following portions of the patient's history were reviewed and updated as appropriate: allergies, current medications, past family history, past medical history, past social history, past surgical history and problem list.  Review of Systems Pertinent items are noted in HPI.   Objective:    BP 126/85  Pulse 73  Temp(Src) 98.3 F (36.8 C)  Ht 5\' 2"  (1.575 m)  Wt 157 lb (71.215 kg)  BMI 28.71 kg/m2  LMP 01/06/2014  Breastfeeding? Yes       Abd: incision well-healed Assessment:     Normal postpartum exam.   Plan:   Follow up in: 1 month or as needed.  Encouraged f/u health dept for chronic hepatitis B

## 2014-02-24 ENCOUNTER — Encounter: Payer: Self-pay | Admitting: Obstetrics & Gynecology

## 2014-02-24 ENCOUNTER — Ambulatory Visit (INDEPENDENT_AMBULATORY_CARE_PROVIDER_SITE_OTHER): Payer: Medicaid Other | Admitting: Obstetrics & Gynecology

## 2014-02-24 NOTE — Progress Notes (Addendum)
Subjective:     Glenda [name] is a 28 y.o. female who presents for a postpartum visit. She is 7 weeks postpartum following a low cervical transverse Cesarean section. I have fully reviewed the prenatal and intrapartum course. The delivery was at 39.2 gestational weeks. Outcome: repeat cesarean section, low transverse incision. Anesthesia: spinal. Postpartum course has been going well. Baby's course has been going well. Baby is feeding by breast. Bleeding no bleeding. Bowel function is normal. Bladder function is normal. Patient is sexually active. Contraception method is tubal ligation. Postpartum depression screening: negative. Pt states that she is having some night sweats.  Pt states that her face and hair feels extremely dry and has a rash on forehead.  Pt states that she was given a cream previously that cleared up her skin.  Pt does not recall what the cream was. Pt has increased BP at today's visit.  Pt states that when home nurse came out her BP was elevated at that time as well.  The following portions of the patient's history were reviewed and updated as appropriate: allergies, current medications, past family history, past medical history, past social history, past surgical history and problem list.  Review of Systems Pertinent items are noted in HPI.   Objective:      General:  alert     Abdomen: soft, non-tender; bowel sounds normal; no masses,  no organomegaly; incision well-healed   Vulva:  normal  Vagina: Left, side wall cyst, upper vagina  Cervix:  no lesions  Corpus: normal size, contour, position, consistency, mobility, non-tender  Adnexa:  normal adnexa         Assessment:     Normal postpartum exam. Borderline B/P elevation.  Plan:    1. Contraception: tubal ligation 2. F/U B/Ps 3. Follow up in: 1 year or as needed.  Referral-->Dermatology--dry patches on face

## 2014-03-04 ENCOUNTER — Encounter: Payer: Self-pay | Admitting: Obstetrics & Gynecology

## 2014-03-19 ENCOUNTER — Encounter: Payer: Self-pay | Admitting: Obstetrics & Gynecology

## 2014-04-28 ENCOUNTER — Encounter: Payer: Self-pay | Admitting: Obstetrics & Gynecology

## 2014-08-20 ENCOUNTER — Telehealth: Payer: Self-pay | Admitting: *Deleted

## 2014-08-20 NOTE — Telephone Encounter (Signed)
Patient reports she has tried to switch her baby to formula,but he doesn't like it. She is having problems with her milk production. Can we give her something to help. Discussed use of OTC products- Fenugreek and patient is willing to try, but she also would like to have a Rx for Reglan in case the Fenugreek does not work. Told patine twould check with her provider and let her know.

## 2014-08-27 NOTE — Telephone Encounter (Signed)
Rx for Reglan OK

## 2014-08-28 ENCOUNTER — Other Ambulatory Visit: Payer: Self-pay | Admitting: *Deleted

## 2014-08-28 DIAGNOSIS — O924 Hypogalactia: Secondary | ICD-10-CM

## 2014-08-28 MED ORDER — METOCLOPRAMIDE HCL 10 MG PO TABS: 10.0000 mg | ORAL_TABLET | Freq: Three times a day (TID) | ORAL | Status: DC | PRN

## 2014-08-28 NOTE — Telephone Encounter (Signed)
Patient notified- Rx called to pharmacy.

## 2014-10-17 ENCOUNTER — Other Ambulatory Visit: Payer: Self-pay | Admitting: *Deleted

## 2014-10-17 DIAGNOSIS — I83813 Varicose veins of bilateral lower extremities with pain: Secondary | ICD-10-CM

## 2014-10-27 ENCOUNTER — Encounter: Payer: Self-pay | Admitting: Obstetrics & Gynecology

## 2014-12-10 ENCOUNTER — Encounter: Payer: Self-pay | Admitting: Vascular Surgery

## 2014-12-11 ENCOUNTER — Encounter: Payer: Self-pay | Admitting: Vascular Surgery

## 2014-12-11 ENCOUNTER — Ambulatory Visit (HOSPITAL_COMMUNITY)
Admission: RE | Admit: 2014-12-11 | Discharge: 2014-12-11 | Disposition: A | Discharge status: Home / Self Care | Payer: Medicaid Other | Source: Ambulatory Visit | Attending: Vascular Surgery | Admitting: Vascular Surgery

## 2014-12-11 ENCOUNTER — Ambulatory Visit (INDEPENDENT_AMBULATORY_CARE_PROVIDER_SITE_OTHER): Payer: Medicaid Other | Admitting: Vascular Surgery

## 2014-12-11 VITALS — BP 154/95 | HR 75 | Ht 62.0 in | Wt 156.0 lb

## 2014-12-11 DIAGNOSIS — I868 Varicose veins of other specified sites: Secondary | ICD-10-CM

## 2014-12-11 DIAGNOSIS — I8390 Asymptomatic varicose veins of unspecified lower extremity: Secondary | ICD-10-CM

## 2014-12-11 DIAGNOSIS — I83813 Varicose veins of bilateral lower extremities with pain: Secondary | ICD-10-CM | POA: Insufficient documentation

## 2014-12-11 NOTE — Progress Notes (Signed)
[facility] [place] HISTORY AND PHYSICAL   History of Present Illness:  Patient is a 28 y.o. year old female who presents for evaluation of symptomatic varicose veins. The patient began to develop varicose veins after her second child was born. She now has 3 children. She works as a Quarry manager at [employer]. She states that her legs become heavy achy and more swollen as the day progresses. She has worn some compression stockings in the past but has not worn these recently. She denies any prior history of DVT or ulcers of her legs. Other medical problems include history of hepatitis B.  Past Medical History  Diagnosis Date  . Medical history non-contributory     Past Surgical History  Procedure Laterality Date  . Cesarean section      x 2   . Cesarean section with bilateral tubal ligation Bilateral 01/06/2014    Procedure: CESAREAN SECTION WITH BILATERAL TUBAL LIGATION;  Surgeon: Lahoma Crocker, MD;  Location: [facility];  Service: Obstetrics;  Laterality: Bilateral;    Social History History  Substance Use Topics  . Smoking status: Never Smoker   . Smokeless tobacco: Never Used  . Alcohol Use: No    Family History Family History  Problem Relation Age of Onset  . Diabetes Maternal Grandmother     Allergies  No Known Allergies   Current Outpatient Prescriptions  Medication Sig Dispense Refill  . Multiple Vitamin (MULTIVITAMIN) tablet Take 1 tablet by mouth daily.    . metoCLOPramide (REGLAN) 10 MG tablet Take 1 tablet (10 mg total) by mouth every 8 (eight) hours as needed for nausea. 10 mg 3 times a day for up to 7-14 days to help increase breast milk production (Patient not taking: Reported on 12/11/2014) 42 tablet 0  . oxyCODONE-acetaminophen (PERCOCET/ROXICET) 5-325 MG per tablet Take 1-2 tablets by mouth every 4 (four) hours as needed for severe pain (moderate - severe pain). (Patient not taking: Reported on 12/11/2014) 30 tablet 0  . Prenatal Vit-Fe  Fumarate-FA (VITAFOL-OB) TABS Take 1 tablet by mouth daily before breakfast. (Patient not taking: Reported on 12/11/2014) 90 each 3   No current facility-administered medications for this visit.    ROS:   General:  No weight loss, Fever, chills  HEENT: No recent headaches, no nasal bleeding, no visual changes, no sore throat  Neurologic: No dizziness, blackouts, seizures. No recent symptoms of stroke or mini- stroke. No recent episodes of slurred speech, or temporary blindness.  Cardiac: No recent episodes of chest pain/pressure, no shortness of breath at rest.  No shortness of breath with exertion.  Denies history of atrial fibrillation or irregular heartbeat  Vascular: No history of rest pain in feet.  No history of claudication.  No history of non-healing ulcer, No history of DVT   Pulmonary: No home oxygen, no productive cough, no hemoptysis,  No asthma or wheezing  Musculoskeletal:  [ ]  Arthritis, [ ]  Low back pain,  [ ]  Joint pain  Hematologic:No history of hypercoagulable state.  No history of easy bleeding.  No history of anemia  Gastrointestinal: No hematochezia or melena,  No gastroesophageal reflux, no trouble swallowing  Urinary: [ ]  chronic Kidney disease, [ ]  on HD - [ ]  MWF or [ ]  TTHS, [ ]  Burning with urination, [ ]  Frequent urination, [ ]  Difficulty urinating;   Skin: No rashes  Psychological: No history of anxiety,  No history of depression   Physical Examination  Filed Vitals:   12/11/14 1124  BP: 154/95  Pulse: 75  Height: 5\' 2"  (1.575 m)  Weight: 156 lb (70.761 kg)  SpO2: 97%    Body mass index is 28.53 kg/(m^2).  General:  Alert and oriented, no acute distress HEENT: Normal Neck: No bruit or JVD Pulmonary: Clear to auscultation bilaterally Cardiac: Regular Rate and Rhythm without murmur Abdomen: Soft, non-tender, non-distended, no mass Skin: No rash, 7 x 5 cm cluster of varicosities right posterior medial calf vein diameter 4-6 mm trace edema  lower extremities bilaterally Extremity Pulses:  2+ radial, brachial, femoral, dorsalis pedis, posterior tibial pulses bilaterally Musculoskeletal: No deformity trace edema  Neurologic: Upper and lower extremity motor 5/5 and symmetric  DATA:  Patient had a venous duplex exam today. This showed no evidence of DVT. There was reflux in the greater saphenous vein bilaterally. There was no deep vein reflux. There was also reflux in the right lesser saphenous vein. Vein diameter on the left was 5-6 mm. Right was also 5-6 mm. I reviewed and interpreted this study.   ASSESSMENT: #1 Symptomatic varicose veins. #2 hypertension   PLAN:  #1 the patient was encouraged to continue wearing her thigh-high compression stockings. She was also encouraged to replace these if they wear off over time. She will try to wear these on a daily basis. She will follow-up in 3 months time for consideration of laser ablation if she is not happy with just compression alone.   The patient was advised to start checking her blood pressure once daily at work. She'll write these numbers down so that Dr. Jeanie Cooks has numbers to look at her next appointment to make sure that she is not chronically hypertensive.  Ruta Hinds, MD [facility] [place] Office: [phone] Pager: [phone]

## 2014-12-22 ENCOUNTER — Encounter: Payer: Self-pay | Admitting: *Deleted

## 2014-12-23 ENCOUNTER — Encounter: Payer: Self-pay | Admitting: Obstetrics & Gynecology

## 2015-02-27 ENCOUNTER — Other Ambulatory Visit: Payer: Self-pay | Admitting: *Deleted

## 2015-02-27 ENCOUNTER — Other Ambulatory Visit: Payer: Self-pay

## 2015-02-27 DIAGNOSIS — I83009 Varicose veins of unspecified lower extremity with ulcer of unspecified site: Secondary | ICD-10-CM

## 2015-02-27 DIAGNOSIS — L97909 Non-pressure chronic ulcer of unspecified part of unspecified lower leg with unspecified severity: Secondary | ICD-10-CM

## 2015-03-05 ENCOUNTER — Encounter: Payer: Self-pay | Admitting: Certified Nurse Midwife

## 2015-03-05 ENCOUNTER — Ambulatory Visit (INDEPENDENT_AMBULATORY_CARE_PROVIDER_SITE_OTHER): Payer: Medicaid Other | Admitting: Certified Nurse Midwife

## 2015-03-05 VITALS — BP 140/80 | HR 77 | Temp 98.7°F | Ht 62.0 in | Wt 160.0 lb

## 2015-03-05 DIAGNOSIS — B1910 Unspecified viral hepatitis B without hepatic coma: Secondary | ICD-10-CM

## 2015-03-05 DIAGNOSIS — Z0000 Encounter for general adult medical examination without abnormal findings: Secondary | ICD-10-CM | POA: Diagnosis not present

## 2015-03-05 DIAGNOSIS — Z113 Encounter for screening for infections with a predominantly sexual mode of transmission: Secondary | ICD-10-CM

## 2015-03-05 DIAGNOSIS — Z01419 Encounter for gynecological examination (general) (routine) without abnormal findings: Secondary | ICD-10-CM

## 2015-03-05 NOTE — Progress Notes (Signed)
Patient ID: Glenda [name], female   DOB: [birth year], 29 y.o.   MRN: [id number]   Subjective:     Glenda [name] is a 29 y.o. female here for a routine exam.  Current complaints:  Has not been seen for hx of Hep. B, desires new PCP & hematology consult.  No other chronic medical conditions.  Had bilateral salpingectomy in Jan. 2015.    Personal health questionnaire:  Is patient Ashkenazi Jewish, have a family history of breast and/or ovarian cancer: no Is there a family history of uterine cancer diagnosed at age < 36, gastrointestinal cancer, urinary tract cancer, family member who is a Field seismologist syndrome-associated carrier: no Is the patient overweight and hypertensive, family history of diabetes, personal history of gestational diabetes, preeclampsia or PCOS: yes Is patient over 38, have PCOS,  family history of premature CHD under age 69, diabetes, smoke, have hypertension or peripheral artery disease:  no At any time, has a partner hit, kicked or otherwise hurt or frightened you?: no Over the past 2 weeks, have you felt down, depressed or hopeless?: no Over the past 2 weeks, have you felt little interest or pleasure in doing things?:no   Gynecologic History Patient's last menstrual period was 02/12/2015. Contraception: post bilateral salpingectomy Last Pap: 07/25/2013. Results were: normal   Obstetric History OB History  Gravida Para Term Preterm AB SAB TAB Ectopic Multiple Living  3 3 3       3     # Outcome Date GA Lbr Len/2nd Weight Sex Delivery Anes PTL Lv  3 Term 01/06/14 [redacted]w[redacted]d  3.147 kg (6 lb 15 oz) M CS-LTranv Spinal  Y  2 Term 02/02/11 [redacted]w[redacted]d 12:00 3.629 kg (8 lb) Berenice Bouton   Y  1 Term 05/05/07 [redacted]w[redacted]d 48:00 3.515 kg (7 lb 12 oz) M CS-LTranv   Y      Past Medical History  Diagnosis Date  . Medical history non-contributory   . Chronic type B viral hepatitis     Past Surgical History  Procedure Laterality Date  . Cesarean section      x 2   . Cesarean section with  bilateral tubal ligation Bilateral 01/06/2014    Procedure: CESAREAN SECTION WITH BILATERAL TUBAL LIGATION;  Surgeon: Lahoma Crocker, MD;  Location: [facility];  Service: Obstetrics;  Laterality: Bilateral;     Current outpatient prescriptions:  Marland Kitchen  Multiple Vitamin (MULTIVITAMIN) tablet, Take 1 tablet by mouth daily., Disp: , Rfl:  No Known Allergies  History  Substance Use Topics  . Smoking status: Never Smoker   . Smokeless tobacco: Never Used  . Alcohol Use: No    Family History  Problem Relation Age of Onset  . Diabetes Maternal Grandmother       Review of Systems  Constitutional: negative for fatigue and weight loss Respiratory: negative for cough and wheezing Cardiovascular: negative for chest pain, fatigue and palpitations Gastrointestinal: negative for abdominal pain and change in bowel habits Musculoskeletal:negative for myalgias Neurological: negative for gait problems and tremors Behavioral/Psych: negative for abusive relationship, depression Endocrine: negative for temperature intolerance   Genitourinary:negative for abnormal menstrual periods, genital lesions, hot flashes, sexual problems and vaginal discharge Integument/breast: negative for breast lump, breast tenderness, nipple discharge and skin lesion(s)    Objective:       BP 140/80 mmHg  Pulse 77  Temp(Src) 98.7 F (37.1 C)  Ht 5\' 2"  (1.575 m)  Wt 72.576 kg (160 lb)  BMI 29.26 kg/m2  LMP 02/12/2015  Breastfeeding? No General:  alert  Skin:   no rash or abnormalities  Lungs:   clear to auscultation bilaterally  Heart:   regular rate and rhythm, S1, S2 normal, no murmur, click, rub or gallop  Breasts:   normal without suspicious masses, skin or nipple changes or axillary nodes  Abdomen:  normal findings: no organomegaly, soft, non-tender and no hernia  Pelvis:  External genitalia: normal general appearance Urinary system: urethral meatus normal and bladder without fullness, nontender Vaginal:  normal without tenderness, induration or masses Cervix: normal appearance Adnexa: normal bimanual exam Uterus: anteverted and non-tender, normal size   Lab Review Urine pregnancy test Labs reviewed yes Radiologic studies reviewed no   Assessment:    Healthy female exam.   Hx of Hepatitis B   Plan:    Education reviewed: safe sex/STD prevention, self breast exams and skin cancer screening. Follow up in: 1 year.   No orders of the defined types were placed in this encounter.   Orders Placed This Encounter  Procedures  . SureSwab, Vaginosis/Vaginitis Plus  . Hepatitis B surface antigen  . RPR  . Hepatitis C antibody  . HIV antibody (with reflex)  . Ambulatory referral to Hematology    Referral Priority:  Routine    Referral Type:  Consultation    Referral Reason:  Specialty Services Required    Requested Specialty:  Oncology    Number of Visits Requested:  1  . Ambulatory referral to Internal Medicine    Referral Priority:  Routine    Referral Type:  Consultation    Referral Reason:  Specialty Services Required    Requested Specialty:  Internal Medicine    Number of Visits Requested:  1

## 2015-03-06 LAB — PAP IG AND HPV HIGH-RISK: HPV DNA HIGH RISK: NOT DETECTED

## 2015-03-06 LAB — HEPATITIS B SURF AG CONFIRMATION: Hepatitis B Surf Ag Confirmation: POSITIVE — AB

## 2015-03-06 LAB — HIV ANTIBODY (ROUTINE TESTING W REFLEX): HIV: NONREACTIVE

## 2015-03-06 LAB — RPR

## 2015-03-06 LAB — HEPATITIS B SURFACE ANTIGEN: HEP B S AG: POSITIVE — AB

## 2015-03-06 LAB — HEPATITIS C ANTIBODY: HCV AB: NEGATIVE

## 2015-03-11 LAB — SURESWAB, VAGINOSIS/VAGINITIS PLUS
Atopobium vaginae: NOT DETECTED Log (cells/mL)
C. ALBICANS, DNA: NOT DETECTED
C. GLABRATA, DNA: NOT DETECTED
C. parapsilosis, DNA: NOT DETECTED
C. trachomatis RNA, TMA: NOT DETECTED
C. tropicalis, DNA: NOT DETECTED
GARDNERELLA VAGINALIS: NOT DETECTED Log (cells/mL)
LACTOBACILLUS SPECIES: 7.2 Log (cells/mL)
MEGASPHAERA SPECIES: NOT DETECTED Log (cells/mL)
N. GONORRHOEAE RNA, TMA: NOT DETECTED
T. vaginalis RNA, QL TMA: NOT DETECTED

## 2015-03-12 ENCOUNTER — Other Ambulatory Visit: Payer: Self-pay | Admitting: *Deleted

## 2015-03-12 DIAGNOSIS — R600 Localized edema: Secondary | ICD-10-CM

## 2015-03-12 DIAGNOSIS — I8390 Asymptomatic varicose veins of unspecified lower extremity: Secondary | ICD-10-CM

## 2015-03-16 ENCOUNTER — Encounter: Payer: Self-pay | Admitting: Vascular Surgery

## 2015-03-17 ENCOUNTER — Other Ambulatory Visit: Payer: Self-pay | Admitting: *Deleted

## 2015-03-17 ENCOUNTER — Encounter: Payer: Self-pay | Admitting: Vascular Surgery

## 2015-03-17 ENCOUNTER — Ambulatory Visit (INDEPENDENT_AMBULATORY_CARE_PROVIDER_SITE_OTHER): Payer: Medicaid Other | Admitting: Vascular Surgery

## 2015-03-17 VITALS — BP 124/88 | HR 72 | Resp 18 | Ht 63.0 in | Wt 159.2 lb

## 2015-03-17 DIAGNOSIS — I83893 Varicose veins of bilateral lower extremities with other complications: Secondary | ICD-10-CM

## 2015-03-17 DIAGNOSIS — I83899 Varicose veins of unspecified lower extremities with other complications: Secondary | ICD-10-CM | POA: Insufficient documentation

## 2015-03-17 NOTE — Progress Notes (Signed)
Problems with Activities of Daily Living Secondary to Leg Pain  1. 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  This is very difficult for her due to leg pain.    2. Glenda [name] states all activities that require prolonged standing (cooking, cleaning, shopping) are difficult for her due to leg pain.       Failure of  Conservative Therapy:  1. Worn 20-30 mm Hg thigh high compression hose >3 months with no relief of symptoms.  2. Frequently elevates legs-no relief of symptoms  3. Taken Ibuprofen 600 Mg TID with no relief of symptoms.  The patient reports today for continued follow-up of her venous hypertension. She reports this is much greater on the right than on the left. She been seen by Dr. Oneida Alar 3 months ago. She has been compliant with her compression garments and reports that this is not given her any relief. She reports that most particularly in her right calf over the area of large varicosities she has pain associated with this. Worse with prolonged standing. 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 Fortunately has had no history of DVT.  Past Medical History  Diagnosis Date  . Medical history non-contributory   . Chronic type B viral hepatitis   . Varicose veins     History  Substance Use Topics  . Smoking status: Never Smoker   . Smokeless tobacco: Never Used  . Alcohol Use: No    Family History  Problem Relation Age of Onset  . Diabetes Maternal Grandmother     No Known Allergies   Current outpatient prescriptions:  Marland Kitchen  Multiple Vitamin (MULTIVITAMIN) tablet, Take 1 tablet by mouth daily., Disp: , Rfl:   Filed Vitals:   03/17/15 1212  BP: 124/88  Pulse: 72  Resp: 18  Height: 5\' 3"  (1.6 m)  Weight: 159 lb 3.2 oz (72.213 kg)    Body mass index is 28.21 kg/(m^2).       On physical exam is well-developed well-nourished female stated age in no acute distress 2+ dorsalis pedis pulses  bilaterally Respirations are equal in nonlabored She does have marked varicosities in her medial right calf and to lesser degree left calf.  I did reimage her veins with SonoSite she does have a markedly enlarged great saphenous vein throughout its course in the right extending directly into these varicosities.  I discussed her formal venous duplex with the patient from 12/11/2014. This shows no significant deep venous reflux. I feel that she would have an out should expect a outstanding result with ablation of great saphenous vein and tributary phlebectomy for symptom relief. I spent this would take approximately 1-1/2 hours under local anesthesia in our office. She wishes to proceed as soon as possible. She is asking or note for extremity duty as her work to limit prolonged standing in front of this for her. We will coordinate the procedure at her earliest convenience

## 2015-03-18 ENCOUNTER — Telehealth: Payer: Self-pay | Admitting: Vascular Surgery

## 2015-03-18 NOTE — Telephone Encounter (Signed)
-----   Message from Rica Records, RN sent at 03/17/2015  5:42 PM EDT ----- Regarding: scheduling Please schedule Ms. Osei-Bonsu for post LA duplex (right leg, order in EPIC) and VV FU with Dr. Donnetta Hutching on 05-07-2015.  Thanks!

## 2015-04-30 ENCOUNTER — Other Ambulatory Visit: Payer: Medicaid Other | Admitting: Vascular Surgery

## 2015-05-07 ENCOUNTER — Encounter (HOSPITAL_COMMUNITY): Payer: Medicaid Other

## 2015-05-07 ENCOUNTER — Ambulatory Visit: Payer: Medicaid Other | Admitting: Vascular Surgery

## 2015-05-12 ENCOUNTER — Encounter: Payer: Self-pay | Admitting: Vascular Surgery

## 2015-05-14 ENCOUNTER — Encounter: Payer: Self-pay | Admitting: Vascular Surgery

## 2015-05-14 ENCOUNTER — Ambulatory Visit (INDEPENDENT_AMBULATORY_CARE_PROVIDER_SITE_OTHER): Payer: Medicaid Other | Admitting: Vascular Surgery

## 2015-05-14 VITALS — BP 125/94 | HR 79 | Resp 18 | Ht 63.0 in | Wt 159.0 lb

## 2015-05-14 DIAGNOSIS — I83893 Varicose veins of bilateral lower extremities with other complications: Secondary | ICD-10-CM | POA: Diagnosis not present

## 2015-05-14 HISTORY — PX: ENDOVENOUS ABLATION SAPHENOUS VEIN W/ LASER: SUR449

## 2015-05-14 NOTE — Progress Notes (Signed)
     Laser Ablation Procedure    Date: 05/14/2015   Glenda [name] DOB:Oct 21, 1986  Consent signed: Yes    Surgeon:  Dr. Sherren Mocha [facility]  Procedure: Laser Ablation: right Greater Saphenous Vein  BP 125/94 mmHg  Pulse 79  Resp 18  Ht 5\' 3"  (1.6 m)  Wt 159 lb (72.122 kg)  BMI 28.17 kg/m2  Tumescent Anesthesia: 425 cc 0.9% NaCl with 50 cc Lidocaine HCL with 1% Epi and 15 cc 8.4% NaHCO3  Local Anesthesia: 4 cc Lidocaine HCL and NaHCO3 (ratio 2:1)  15 watts continuous mode        Total energy: 2446 Joules   Total time: 2:42    Stab Phlebectomy: <10 Incisions Sites: Calf (right leg)  Patient tolerated procedure well   Description of Procedure:  After marking the course of the secondary varicosities, the patient was placed on the operating table in the supine position, and the right leg was prepped and draped in sterile fashion.   Local anesthetic was administered and under ultrasound guidance the saphenous vein was accessed with a micro needle and guide wire; then the mirco puncture sheath was placed.  A guide wire was inserted saphenofemoral junction , followed by a 5 french sheath.  The position of the sheath and then the laser fiber below the junction was confirmed using the ultrasound.  Tumescent anesthesia was administered along the course of the saphenous vein using ultrasound guidance. The patient was placed in Trendelenburg position and protective laser glasses were placed on patient and staff, and the laser was fired at 15 watts continuous mode advancing 1-36mm/second for a total of 2446 joules.   For stab phlebectomies, local anesthetic was administered at the previously marked varicosities, and tumescent anesthesia was administered around the vessels. Less than ten stab wounds were made using the tip of an 11 blade. And using the vein hook, the phlebectomies were performed using a hemostat to avulse the varicosities.  Adequate hemostasis was achieved.     Steri strips were  applied to the stab wounds and ABD pads and thigh high compression stockings were applied.  Ace wrap bandages were applied over the phlebectomy sites and at the top of the saphenofemoral junction. Blood loss was less than 15 cc.  The patient ambulated out of the operating room having tolerated the procedure well.   uneventful ablation from the area at the knee to saphenofemoral junction and stab phlebectomy varicosities at the medial calf and 1 at the pretibial area.

## 2015-05-15 ENCOUNTER — Telehealth: Payer: Self-pay | Admitting: *Deleted

## 2015-05-15 NOTE — Telephone Encounter (Signed)
    05/15/2015  Time: 10:47 AM   Patient Name: Glenda [name]  Patient of: T.F. Early  Procedure:Laser Ablation right greater saphenous vein and stab phlebectomy <10 incisions right leg 05-14-2015  Reached patient at home and checked  Her status  Yes    Comments/Actions Taken: Glenda [name] states she is "doing very well."  States mild/moderate right leg pain (inner thigh) and no right leg swelling.  States she is using ice compress to painful area (right inner thigh) and is keeping right leg elevated and using Ibuprofen as directed.  Reviewed post procedural instructions with her and reminded her of post LA duplex and VV follow up appointment with Dr. Donnetta Hutching on 05-21-2015.      @SIGNATURE @

## 2015-05-16 ENCOUNTER — Emergency Department (HOSPITAL_COMMUNITY)
Admission: EM | Admit: 2015-05-16 | Discharge: 2015-05-16 | Disposition: A | Discharge status: Home / Self Care | Payer: Medicaid Other | Attending: Emergency Medicine | Admitting: Emergency Medicine

## 2015-05-16 ENCOUNTER — Emergency Department (HOSPITAL_BASED_OUTPATIENT_CLINIC_OR_DEPARTMENT_OTHER)
Admit: 2015-05-16 | Discharge: 2015-05-16 | Disposition: A | Discharge status: Home / Self Care | Payer: Medicaid Other | Attending: Emergency Medicine | Admitting: Emergency Medicine

## 2015-05-16 ENCOUNTER — Encounter (HOSPITAL_COMMUNITY): Payer: Self-pay | Admitting: *Deleted

## 2015-05-16 DIAGNOSIS — M7989 Other specified soft tissue disorders: Secondary | ICD-10-CM | POA: Diagnosis not present

## 2015-05-16 DIAGNOSIS — Z8619 Personal history of other infectious and parasitic diseases: Secondary | ICD-10-CM | POA: Diagnosis not present

## 2015-05-16 DIAGNOSIS — M79604 Pain in right leg: Secondary | ICD-10-CM | POA: Diagnosis present

## 2015-05-16 DIAGNOSIS — Z8679 Personal history of other diseases of the circulatory system: Secondary | ICD-10-CM | POA: Diagnosis not present

## 2015-05-16 DIAGNOSIS — Z79899 Other long term (current) drug therapy: Secondary | ICD-10-CM | POA: Diagnosis not present

## 2015-05-16 MED ORDER — IBUPROFEN 800 MG PO TABS: 800.0000 mg | ORAL_TABLET | Freq: Three times a day (TID) | ORAL | Status: DC | PRN

## 2015-05-16 MED ORDER — IBUPROFEN 800 MG PO TABS
800.0000 mg | ORAL_TABLET | Freq: Once | ORAL | Status: AC
  Administered 2015-05-16: 800 mg via ORAL
  Filled 2015-05-16: qty 1

## 2015-05-16 NOTE — ED Notes (Signed)
Pt is in stable condition upon d/c and is escorted from ED by staff via wheelchair.

## 2015-05-16 NOTE — ED Notes (Signed)
Patient had vein surgery performed on Thursday by md on [facility].  Dr Normand Sloop.  Patient states the procedure was laser.  She states he began having pain in her leg this morning at 0400 and her right foot is also swollen.  Patient did call the office and was told to come to ED.  Last took ibuprofen at midnight.

## 2015-05-16 NOTE — Progress Notes (Signed)
VASCULAR LAB PRELIMINARY  PRELIMINARY  PRELIMINARY  PRELIMINARY  Right lower extremity venous Doppler completed.    Preliminary report:  There is no DVT or SVT noted in the right lower extremity.   Finnley Lewis, RVT 05/16/2015, 10:01 AM

## 2015-05-16 NOTE — ED Provider Notes (Signed)
CSN: [id number]     Arrival date & time 05/16/15  6578 History   First MD Initiated Contact with Patient 05/16/15 [phone]     Chief Complaint  Patient presents with  . Leg Pain     (Consider location/radiation/quality/duration/timing/severity/associated sxs/prior Treatment) HPI Patient presents to the emergency department with pain and swelling in her right leg.  The patient had a laser varicose vein procedure done Thursday and states that everything was doing well after the procedure, but then yesterday noticed that she had redness and swelling to her lower leg.  Patient states that there was also increasing pain during that time.  Patient states that palpation and movement make the pain worse.  The patient states that she did not take anything other than ibuprofen prior to arrival.  She states the ibuprofen did help.  The patient denies shortness breath, chest pain, weakness, dizziness, headache, blurred vision, fever, abdominal pain, nausea, vomiting, or syncope.  The patient states that she spoke with the vascular surgeon that did the procedure and was advised to come to the emergency department. Past Medical History  Diagnosis Date  . Medical history non-contributory   . Chronic type B viral hepatitis   . Varicose veins    Past Surgical History  Procedure Laterality Date  . Cesarean section      x 2   . Cesarean section with bilateral tubal ligation Bilateral 01/06/2014    Procedure: CESAREAN SECTION WITH BILATERAL TUBAL LIGATION;  Surgeon: Lahoma Crocker, MD;  Location: [facility];  Service: Obstetrics;  Laterality: Bilateral;  . Endovenous ablation saphenous vein w/ laser Right 05-14-2015    endovenous laser ablation right greater saphenous vein and stab phlebectomy right leg by Curt Jews MD   Family History  Problem Relation Age of Onset  . Diabetes Maternal Grandmother    History  Substance Use Topics  . Smoking status: Never Smoker   . Smokeless tobacco: Never Used  .  Alcohol Use: No   OB History    Gravida Para Term Preterm AB TAB SAB Ectopic Multiple Living   3 3 3       3      Review of Systems All other systems negative except as documented in the HPI. All pertinent positives and negatives as reviewed in the HPI.   Allergies  Review of patient's allergies indicates no known allergies.  Home Medications   Prior to Admission medications   Medication Sig Start Date End Date Taking? Authorizing Provider  ibuprofen (ADVIL,MOTRIN) 200 MG tablet Take 600 mg by mouth every 6 (six) hours as needed for mild pain or moderate pain.   Yes Historical Provider, MD  Multiple Vitamin (MULTIVITAMIN) tablet Take 1 tablet by mouth daily.   Yes Historical Provider, MD   BP 151/92 mmHg  Pulse 73  Temp(Src) 98.1 F (36.7 C) (Oral)  Resp 18  Ht 5\' 1"  (1.549 m)  Wt 160 lb (72.576 kg)  BMI 30.25 kg/m2  SpO2 97% Physical Exam  Constitutional: She is oriented to person, place, and time. She appears well-developed and well-nourished. No distress.  HENT:  Head: Normocephalic and atraumatic.  Mouth/Throat: Oropharynx is clear and moist.  Eyes: Pupils are equal, round, and reactive to light.  Neck: Normal range of motion. Neck supple.  Cardiovascular: Normal rate, regular rhythm and normal heart sounds.  Exam reveals no gallop and no friction rub.   No murmur heard. Pulmonary/Chest: Effort normal and breath sounds normal. No respiratory distress.  Musculoskeletal:  Legs: Neurological: She is alert and oriented to person, place, and time. She exhibits normal muscle tone. Coordination normal.  Skin: Skin is warm and dry. No rash noted. No erythema.    ED Course  Procedures (including critical care time)   MDM   Final diagnoses:  None     Patient will be referred back to her vascular surgeon.  Told to return here as needed.  The DVT study did not show any signs of DVT in this leg.  I do not believe the areas of cellulitic as they just appear to be  inflamed from the procedure and the swelling is most likely due to the procedures well  Dalia Heading, PA-C 05/16/15 [place], MD 05/18/15 [phone]

## 2015-05-16 NOTE — Discharge Instructions (Signed)
Your vascular study shows that there is known that clotted Follow-up with the surgeon that performed the procedure.  Return here as needed.  Use warm compresses on the area

## 2015-05-18 ENCOUNTER — Telehealth: Payer: Self-pay | Admitting: Vascular Surgery

## 2015-05-18 NOTE — Telephone Encounter (Signed)
-----   Message from Angelia Mould, MD sent at 05/16/2015  7:18 AM EDT ----- Regarding: phone call This patient had laser ablation of the saphenous vein 2 days ago and called complaining of significant leg pain. I encouraged her to elevate her leg, use warm compresses, and take ibuprofen. I told her that if she had significant leg swelling or if here pain persisted she should go to the emergency department at [facility] CD

## 2015-05-19 ENCOUNTER — Telehealth: Payer: Self-pay | Admitting: *Deleted

## 2015-05-19 NOTE — Telephone Encounter (Signed)
Follow up call checking on Glenda [name] status.  She is s/p endovenous laser ablation right greater saphenous vein and stab phlebectomy 10-20 incisions right leg 05-14-2015 by Curt Jews MD.  Glenda [name] states she experienced significant right leg pain and swelling in early morning hours of 05-16-2015 and spoke by telephone with Deitra Mayo MD.  She states she went to the ED morning of 05-16-2015 upon his advice.  Per EPIC records, venous duplex was negative for DVT of right lower extremity and she was evaluated by ED physician Jola Schmidt, MD) and discharged home.  Glenda [name] stated her right leg pain and swelling is "much better now".  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 Donnetta Hutching on 05-21-2015. Reviewed with her to keep right leg elevated when possible , take Ibuprofen as directed, use ice compresses prn, and continue wearing thigh high compression hose daytime.  Glenda [name] will call [facility] if she has further questions or concerns.

## 2015-05-20 ENCOUNTER — Encounter: Payer: Self-pay | Admitting: Vascular Surgery

## 2015-05-21 ENCOUNTER — Encounter: Payer: Self-pay | Admitting: Vascular Surgery

## 2015-05-21 ENCOUNTER — Encounter (HOSPITAL_COMMUNITY): Payer: Medicaid Other

## 2015-05-21 ENCOUNTER — Ambulatory Visit (INDEPENDENT_AMBULATORY_CARE_PROVIDER_SITE_OTHER): Payer: Medicaid Other | Admitting: Vascular Surgery

## 2015-05-21 ENCOUNTER — Ambulatory Visit (HOSPITAL_COMMUNITY)
Admission: RE | Admit: 2015-05-21 | Discharge: 2015-05-21 | Disposition: A | Discharge status: Home / Self Care | Payer: Medicaid Other | Source: Ambulatory Visit | Attending: Vascular Surgery | Admitting: Vascular Surgery

## 2015-05-21 ENCOUNTER — Ambulatory Visit: Payer: Medicaid Other | Admitting: Vascular Surgery

## 2015-05-21 VITALS — BP 127/86 | HR 79 | Resp 16 | Ht 61.0 in | Wt 160.0 lb

## 2015-05-21 DIAGNOSIS — I83893 Varicose veins of bilateral lower extremities with other complications: Secondary | ICD-10-CM

## 2015-05-21 DIAGNOSIS — I82811 Embolism and thrombosis of superficial veins of right lower extremities: Secondary | ICD-10-CM | POA: Diagnosis not present

## 2015-05-21 NOTE — Progress Notes (Signed)
Patient resents a one-week follow-up after laser ablation of the great saphenous vein. She also had phlebectomy of several tributaries in her calf. She called over the weekend with concern regarding more than typical pain. She presented to the emergency department and had a negative DVT study at that time.  He reports that she had the discomfort more in the phlebectomy site below her knee. Felt as though there was something leaking. Typical discomfort in her thigh.  On physical exam she has minimal bruising and excellent healing of her phlebectomy sites.  Duplex today shows no evidence of DVT. 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 She will wear her compression for one additional week and then see [place] on an as-needed basis following this

## 2015-10-20 ENCOUNTER — Ambulatory Visit: Payer: Medicaid Other | Admitting: Certified Nurse Midwife

## 2016-01-29 ENCOUNTER — Encounter: Payer: Self-pay | Admitting: *Deleted

## 2016-02-22 ENCOUNTER — Ambulatory Visit (INDEPENDENT_AMBULATORY_CARE_PROVIDER_SITE_OTHER): Payer: Self-pay | Admitting: Obstetrics & Gynecology

## 2016-02-22 ENCOUNTER — Encounter: Payer: Self-pay | Admitting: Obstetrics & Gynecology

## 2016-02-22 ENCOUNTER — Encounter: Payer: Medicaid Other | Admitting: Obstetrics & Gynecology

## 2016-02-22 VITALS — BP 128/83 | HR 86 | Ht 63.0 in | Wt 167.0 lb

## 2016-02-22 DIAGNOSIS — N939 Abnormal uterine and vaginal bleeding, unspecified: Secondary | ICD-10-CM

## 2016-02-22 MED ORDER — NORGESTIMATE-ETH ESTRADIOL 0.25-35 MG-MCG PO TABS: 1.0000 | ORAL_TABLET | Freq: Every day | ORAL | Status: DC

## 2016-02-22 NOTE — Progress Notes (Signed)
Patient ID: Glenda [name], female   DOB: January 19, 1986, 30 y.o.   MRN: [id number] History:  30 y.o. [id number] here today for f/u of AUB.  Pt reports heavy cycles that are 26 days. She reports that they last 5 days.  She reports changing pads every 2 hours.  Pt reports beign married.  Pt reports only mild pain with the cycles.    Pt was seen at the HD but, took nothing for the bleeding. Sx have been present for 2 years.   The following portions of the patient's history were reviewed and updated as appropriate: allergies, current medications, past family history, past medical history, past social history, past surgical history and problem list.  Review of Systems:  Pertinent items are noted in HPI.  Objective:  Physical Exam Blood pressure 128/83, pulse 86, height 5\' 3"  (1.6 m), weight 167 lb (75.751 kg), last menstrual period 02/21/2016. Gen: NAD Lungs: CTA CV: RRR Abd: Soft, nontender and nondistended Pelvic:  Pt refused GYN exam as she is on her menses  Labs and Imaging No results found.  Assessment & Plan:  AUB- s/p eval at the HD but, no tx offered  Sprintec 1 po q day Pt agrees to f/u in 3 months for an exam If no improvement will obtain sono after exam  Glenda [name] L. Harraway-Smith, M.D., Glenda [name]

## 2016-02-22 NOTE — Patient Instructions (Signed)
Oral Contraception Use Oral contraceptive pills (OCPs) are medicines taken to prevent pregnancy. OCPs work by preventing the ovaries from releasing eggs. The hormones in OCPs also cause the cervical mucus to thicken, preventing the sperm from entering the uterus. The hormones also cause the uterine lining to become thin, not allowing a fertilized egg to attach to the inside of the uterus. OCPs are highly effective when taken exactly as prescribed. However, OCPs do not prevent sexually transmitted diseases (STDs). Safe sex practices, such as using condoms along with an OCP, can help prevent STDs. Before taking OCPs, you may have a physical exam and Pap test. Your health care provider may also order blood tests if necessary. Your health care provider will make sure you are a good candidate for oral contraception. Discuss with your health care provider the possible side effects of the OCP you may be prescribed. When starting an OCP, it can take 2 to 3 months for the body to adjust to the changes in hormone levels in your body.  HOW TO TAKE ORAL CONTRACEPTIVE PILLS Your health care provider may advise you on how to start taking the first cycle of OCPs. Otherwise, you can:   Start on day 1 of your menstrual period. You will not need any backup contraceptive protection with this start time.   Start on the first Sunday after your menstrual period or the day you get your prescription. In these cases, you will need to use backup contraceptive protection for the first week.   Start the pill at any time of your cycle. If you take the pill within 5 days of the start of your period, you are protected against pregnancy right away. In this case, you will not need a backup form of birth control. If you start at any other time of your menstrual cycle, you will need to use another form of birth control for 7 days. If your OCP is the type called a minipill, it will protect you from pregnancy after taking it for 2 days (48  hours). After you have started taking OCPs:   If you forget to take 1 pill, take it as soon as you remember. Take the next pill at the regular time.   If you miss 2 or more pills, call your health care provider because different pills have different instructions for missed doses. Use backup birth control until your next menstrual period starts.   If you use a 28-day pack that contains inactive pills and you miss 1 of the last 7 pills (pills with no hormones), it will not matter. Throw away the rest of the non-hormone pills and start a new pill pack.  No matter which day you start the OCP, you will always start a new pack on that same day of the week. Have an extra pack of OCPs and a backup contraceptive method available in case you miss some pills or lose your OCP pack.  HOME CARE INSTRUCTIONS   Do not smoke.   Always use a condom to protect against STDs. OCPs do not protect against STDs.   Use a calendar to mark your menstrual period days.   Read the information and directions that came with your OCP. Talk to your health care provider if you have questions.  SEEK MEDICAL CARE IF:   You develop nausea and vomiting.   You have abnormal vaginal discharge or bleeding.   You develop a rash.   You miss your menstrual period.   You are losing   your hair.   You need treatment for mood swings or depression.   You get dizzy when taking the OCP.   You develop acne from taking the OCP.   You become pregnant.  SEEK IMMEDIATE MEDICAL CARE IF:   You develop chest pain.   You develop shortness of breath.   You have an uncontrolled or severe headache.   You develop numbness or slurred speech.   You develop visual problems.   You develop pain, redness, and swelling in the legs.    This information is not intended to replace advice given to you by your health care provider. Make sure you discuss any questions you have with your health care provider.   Document  Released: 12/01/2011 Document Revised: 01/02/2015 Document Reviewed: 06/02/2013 Elsevier Interactive Patient Education 2016 Elsevier Inc.  

## 2017-02-28 ENCOUNTER — Encounter: Payer: Self-pay | Admitting: Obstetrics and Gynecology

## 2017-02-28 ENCOUNTER — Ambulatory Visit (INDEPENDENT_AMBULATORY_CARE_PROVIDER_SITE_OTHER): Payer: BLUE CROSS/BLUE SHIELD | Admitting: Obstetrics and Gynecology

## 2017-02-28 DIAGNOSIS — N920 Excessive and frequent menstruation with regular cycle: Secondary | ICD-10-CM | POA: Insufficient documentation

## 2017-02-28 DIAGNOSIS — Z01419 Encounter for gynecological examination (general) (routine) without abnormal findings: Secondary | ICD-10-CM | POA: Insufficient documentation

## 2017-02-28 DIAGNOSIS — Z0000 Encounter for general adult medical examination without abnormal findings: Secondary | ICD-10-CM

## 2017-02-28 MED ORDER — TRANEXAMIC ACID 650 MG PO TABS: 1300.0000 mg | ORAL_TABLET | Freq: Three times a day (TID) | ORAL | 2 refills | Status: DC

## 2017-02-28 NOTE — Progress Notes (Signed)
Pt states that her cycles are approx every 3 weeks, heavier and lasting about 5 days.  Pt states this change after she had tubal in 2015.

## 2017-02-28 NOTE — Patient Instructions (Signed)
Health Maintenance, Female Adopting a healthy lifestyle and getting preventive care can go a long way to promote health and wellness. Talk with your health care provider about what schedule of regular examinations is right for you. This is a good chance for you to check in with your provider about disease prevention and staying healthy. In between checkups, there are plenty of things you can do on your own. Experts have done a lot of research about which lifestyle changes and preventive measures are most likely to keep you healthy. Ask your health care provider for more information. Weight and diet Eat a healthy diet  Be sure to include plenty of vegetables, fruits, low-fat dairy products, and lean protein.  Do not eat a lot of foods high in solid fats, added sugars, or salt.  Get regular exercise. This is one of the most important things you can do for your health.  Most adults should exercise for at least 150 minutes each week. The exercise should increase your heart rate and make you sweat (moderate-intensity exercise).  Most adults should also do strengthening exercises at least twice a week. This is in addition to the moderate-intensity exercise. Maintain a healthy weight  Body mass index (BMI) is a measurement that can be used to identify possible weight problems. It estimates body fat based on height and weight. Your health care provider can help determine your BMI and help you achieve or maintain a healthy weight.  For females 76 years of age and older:  A BMI below 18.5 is considered underweight.  A BMI of 18.5 to 24.9 is normal.  A BMI of 25 to 29.9 is considered overweight.  A BMI of 30 and above is considered obese. Watch levels of cholesterol and blood lipids  You should start having your blood tested for lipids and cholesterol at 32 years of age, then have this test every 5 years.  You may need to have your cholesterol levels checked more often if:  Your lipid or  cholesterol levels are high.  You are older than 32 years of age.  You are at high risk for heart disease. Cancer screening Lung Cancer  Lung cancer screening is recommended for adults 64-42 years old who are at high risk for lung cancer because of a history of smoking.  A yearly low-dose CT scan of the lungs is recommended for people who:  Currently smoke.  Have quit within the past 15 years.  Have at least a 30-pack-year history of smoking. A pack year is smoking an average of one pack of cigarettes a day for 1 year.  Yearly screening should continue until it has been 15 years since you quit.  Yearly screening should stop if you develop a health problem that would prevent you from having lung cancer treatment. Breast Cancer  Practice breast self-awareness. This means understanding how your breasts normally appear and feel.  It also means doing regular breast self-exams. Let your health care provider know about any changes, no matter how small.  If you are in your 20s or 30s, you should have a clinical breast exam (CBE) by a health care provider every 1-3 years as part of a regular health exam.  If you are 34 or older, have a CBE every year. Also consider having a breast X-ray (mammogram) every year.  If you have a family history of breast cancer, talk to your health care provider about genetic screening.  If you are at high risk for breast cancer, talk  to your health care provider about having an MRI and a mammogram every year.  Breast cancer gene (BRCA) assessment is recommended for women who have family members with BRCA-related cancers. BRCA-related cancers include:  Breast.  Ovarian.  Tubal.  Peritoneal cancers.  Results of the assessment will determine the need for genetic counseling and BRCA1 and BRCA2 testing. Cervical Cancer  Your health care provider may recommend that you be screened regularly for cancer of the pelvic organs (ovaries, uterus, and vagina).  This screening involves a pelvic examination, including checking for microscopic changes to the surface of your cervix (Pap test). You may be encouraged to have this screening done every 3 years, beginning at age 24.  For women ages 66-65, health care providers may recommend pelvic exams and Pap testing every 3 years, or they may recommend the Pap and pelvic exam, combined with testing for human papilloma virus (HPV), every 5 years. Some types of HPV increase your risk of cervical cancer. Testing for HPV may also be done on women of any age with unclear Pap test results.  Other health care providers may not recommend any screening for nonpregnant women who are considered low risk for pelvic cancer and who do not have symptoms. Ask your health care provider if a screening pelvic exam is right for you.  If you have had past treatment for cervical cancer or a condition that could lead to cancer, you need Pap tests and screening for cancer for at least 20 years after your treatment. If Pap tests have been discontinued, your risk factors (such as having a new sexual partner) need to be reassessed to determine if screening should resume. Some women have medical problems that increase the chance of getting cervical cancer. In these cases, your health care provider may recommend more frequent screening and Pap tests. Colorectal Cancer  This type of cancer can be detected and often prevented.  Routine colorectal cancer screening usually begins at 32 years of age and continues through 32 years of age.  Your health care provider may recommend screening at an earlier age if you have risk factors for colon cancer.  Your health care provider may also recommend using home test kits to check for hidden blood in the stool.  A small camera at the end of a tube can be used to examine your colon directly (sigmoidoscopy or colonoscopy). This is done to check for the earliest forms of colorectal cancer.  Routine  screening usually begins at age 41.  Direct examination of the colon should be repeated every 5-10 years through 32 years of age. However, you may need to be screened more often if early forms of precancerous polyps or small growths are found. Skin Cancer  Check your skin from head to toe regularly.  Tell your health care provider about any new moles or changes in moles, especially if there is a change in a mole's shape or color.  Also tell your health care provider if you have a mole that is larger than the size of a pencil eraser.  Always use sunscreen. Apply sunscreen liberally and repeatedly throughout the day.  Protect yourself by wearing long sleeves, pants, a wide-brimmed hat, and sunglasses whenever you are outside. Heart disease, diabetes, and high blood pressure  High blood pressure causes heart disease and increases the risk of stroke. High blood pressure is more likely to develop in:  People who have blood pressure in the high end of the normal range (130-139/85-89 mm Hg).  People who are overweight or obese.  People who are African American.  If you are 59-24 years of age, have your blood pressure checked every 3-5 years. If you are 34 years of age or older, have your blood pressure checked every year. You should have your blood pressure measured twice-once when you are at a hospital or clinic, and once when you are not at a hospital or clinic. Record the average of the two measurements. To check your blood pressure when you are not at a hospital or clinic, you can use:  An automated blood pressure machine at a pharmacy.  A home blood pressure monitor.  If you are between 29 years and 60 years old, ask your health care provider if you should take aspirin to prevent strokes.  Have regular diabetes screenings. This involves taking a blood sample to check your fasting blood sugar level.  If you are at a normal weight and have a low risk for diabetes, have this test once  every three years after 32 years of age.  If you are overweight and have a high risk for diabetes, consider being tested at a younger age or more often. Preventing infection Hepatitis B  If you have a higher risk for hepatitis B, you should be screened for this virus. You are considered at high risk for hepatitis B if:  You were born in a country where hepatitis B is common. Ask your health care provider which countries are considered high risk.  Your parents were born in a high-risk country, and you have not been immunized against hepatitis B (hepatitis B vaccine).  You have HIV or AIDS.  You use needles to inject street drugs.  You live with someone who has hepatitis B.  You have had sex with someone who has hepatitis B.  You get hemodialysis treatment.  You take certain medicines for conditions, including cancer, organ transplantation, and autoimmune conditions. Hepatitis C  Blood testing is recommended for:  Everyone born from [birth year] through 1966.  Anyone with known risk factors for hepatitis C. Sexually transmitted infections (STIs)  You should be screened for sexually transmitted infections (STIs) including gonorrhea and chlamydia if:  You are sexually active and are younger than 32 years of age.  You are older than 32 years of age and your health care provider tells you that you are at risk for this type of infection.  Your sexual activity has changed since you were last screened and you are at an increased risk for chlamydia or gonorrhea. Ask your health care provider if you are at risk.  If you do not have HIV, but are at risk, it may be recommended that you take a prescription medicine daily to prevent HIV infection. This is called pre-exposure prophylaxis (PrEP). You are considered at risk if:  You are sexually active and do not regularly use condoms or know the HIV status of your partner(s).  You take drugs by injection.  You are sexually active with a partner  who has HIV. Talk with your health care provider about whether you are at high risk of being infected with HIV. If you choose to begin PrEP, you should first be tested for HIV. You should then be tested every 3 months for as long as you are taking PrEP. Pregnancy  If you are premenopausal and you may become pregnant, ask your health care provider about preconception counseling.  If you may become pregnant, take 400 to 800 micrograms (mcg) of folic acid  every day.  If you want to prevent pregnancy, talk to your health care provider about birth control (contraception). Osteoporosis and menopause  Osteoporosis is a disease in which the bones lose minerals and strength with aging. This can result in serious bone fractures. Your risk for osteoporosis can be identified using a bone density scan.  If you are 4 years of age or older, or if you are at risk for osteoporosis and fractures, ask your health care provider if you should be screened.  Ask your health care provider whether you should take a calcium or vitamin D supplement to lower your risk for osteoporosis.  Menopause may have certain physical symptoms and risks.  Hormone replacement therapy may reduce some of these symptoms and risks. Talk to your health care provider about whether hormone replacement therapy is right for you. Follow these instructions at home:  Schedule regular health, dental, and eye exams.  Stay current with your immunizations.  Do not use any tobacco products including cigarettes, chewing tobacco, or electronic cigarettes.  If you are pregnant, do not drink alcohol.  If you are breastfeeding, limit how much and how often you drink alcohol.  Limit alcohol intake to no more than 1 drink per day for nonpregnant women. One drink equals 12 ounces of beer, 5 ounces of wine, or 1 ounces of hard liquor.  Do not use street drugs.  Do not share needles.  Ask your health care provider for help if you need support  or information about quitting drugs.  Tell your health care provider if you often feel depressed.  Tell your health care provider if you have ever been abused or do not feel safe at home. This information is not intended to replace advice given to you by your health care provider. Make sure you discuss any questions you have with your health care provider. Document Released: 06/27/2011 Document Revised: 05/19/2016 Document Reviewed: 09/15/2015 Elsevier Interactive Patient Education  2017 [employer].

## 2017-02-28 NOTE — Progress Notes (Signed)
Glenda [name] is a 31 y.o. [id number] female here for a routine annual gynecologic exam. She reports that her cycles are still heavy. Was sen last year with this complaint and prescribed OCP's. She did not take.  Denies abnormal vaginal bleeding, discharge, pelvic pain, problems with intercourse or other gynecologic concerns.    Gynecologic History Patient's last menstrual period was 02/19/2017. Contraception: tubal ligation Last Pap: 2016. Results were: normal Last mammogram: NA. Results were: NA  Obstetric History OB History  Gravida Para Term Preterm AB Living  3 3 3     3   SAB TAB Ectopic Multiple Live Births          3    # Outcome Date GA Lbr Len/2nd Weight Sex Delivery Anes PTL Lv  3 Term 01/06/14 [redacted]w[redacted]d  6 lb 15 oz (3.147 kg) M CS-LTranv Spinal  LIV  2 Term 02/02/11 [redacted]w[redacted]d 12:00 8 lb (3.629 kg) M CS-LTranv   LIV  1 Term 05/05/07 [redacted]w[redacted]d 48:00 7 lb 12 oz (3.515 kg) M CS-LTranv   LIV      Past Medical History:  Diagnosis Date  . Chronic type B viral hepatitis ([place])   . Medical history non-contributory   . Varicose veins     Past Surgical History:  Procedure Laterality Date  . CESAREAN SECTION     x 2   . CESAREAN SECTION WITH BILATERAL TUBAL LIGATION Bilateral 01/06/2014   Procedure: CESAREAN SECTION WITH BILATERAL TUBAL LIGATION;  Surgeon: Lahoma Crocker, MD;  Location: [facility];  Service: Obstetrics;  Laterality: Bilateral;  . ENDOVENOUS ABLATION SAPHENOUS VEIN W/ LASER Right 05-14-2015   endovenous laser ablation right greater saphenous vein and stab phlebectomy right leg by Curt Jews MD    Current Outpatient Prescriptions on File Prior to Visit  Medication Sig Dispense Refill  . Multiple Vitamin (MULTIVITAMIN) tablet Take 1 tablet by mouth daily.    Marland Kitchen ibuprofen (ADVIL,MOTRIN) 800 MG tablet Take 1 tablet (800 mg total) by mouth every 8 (eight) hours as needed. (Patient not taking: Reported on 02/28/2017) 21 tablet 0   No current facility-administered medications on  file prior to visit.     No Known Allergies  Social History   Social History  . Marital status: Married    Spouse name: N/A  . Number of children: 2  . Years of education: N/A   Occupational History  . [employer] C[employer]     [employer]   Social History Main Topics  . Smoking status: Never Smoker  . Smokeless tobacco: Never Used  . Alcohol use No  . Drug use: No  . Sexual activity: Yes    Partners: Male    Birth control/ protection: None, Surgical   Other Topics Concern  . Not on file   Social History Narrative  . No narrative on file    Family History  Problem Relation Age of Onset  . Diabetes Maternal Grandmother     The following portions of the patient's history were reviewed and updated as appropriate: allergies, current medications, past family history, past medical history, past social history, past surgical history and problem list.  Review of Systems Pertinent items noted in HPI and remainder of comprehensive ROS otherwise negative.   Objective:  BP 113/74   Pulse 71   Ht 5\' 2"  (1.575 m)   Wt 164 lb (74.4 kg)   LMP 02/19/2017   BMI 30.00 kg/m  CONSTITUTIONAL: Well-developed, well-nourished female in no acute distress.  HENT:  Normocephalic,  atraumatic, External right and left ear normal. Oropharynx is clear and moist EYES: Conjunctivae and EOM are normal. Pupils are equal, round, and reactive to light. No scleral icterus.  NECK: Normal range of motion, supple, no masses.  Normal thyroid.  SKIN: Skin is warm and dry. No rash noted. Not diaphoretic. No erythema. No pallor. [place]: Alert and oriented to person, place, and time. Normal reflexes, muscle tone coordination. No cranial nerve deficit noted. PSYCHIATRIC: Normal mood and affect. Normal behavior. Normal judgment and thought content. CARDIOVASCULAR: Normal heart rate noted, regular rhythm RESPIRATORY: Clear to auscultation bilaterally. Effort and breath sounds normal, no problems with  respiration noted. BREASTS: Symmetric in size. No masses, skin changes, nipple drainage, or lymphadenopathy. ABDOMEN: Soft, normal bowel sounds, no distention noted.  No tenderness, rebound or guarding.  PELVIC: Normal appearing external genitalia; normal appearing vaginal mucosa and cervix.  No abnormal discharge noted.  Pap smear obtained.  Normal uterine size, no other palpable masses, no uterine or adnexal tenderness. MUSCULOSKELETAL: Normal range of motion. No tenderness.  No cyanosis, clubbing, or edema.  2+ distal pulses.   Assessment:  Annual gynecologic examination with pap smear  Heavy cycles Plan:  Will follow up results of pap smear and manage accordingly. Discussed treatment options for heavy cycles. Prefers non hormonal. Will try Lystedia. U/R reviewed Routine preventative health maintenance measures emphasized. Please refer to After Visit Summary for other counseling recommendations.    Chancy Milroy, MD, [facility] Attending [place] for [facility], [place]

## 2017-03-02 LAB — CYTOLOGY - PAP
DIAGNOSIS: NEGATIVE
HPV: NOT DETECTED

## 2017-03-07 ENCOUNTER — Encounter: Payer: Self-pay | Admitting: *Deleted

## 2019-03-25 ENCOUNTER — Other Ambulatory Visit: Payer: Self-pay | Admitting: Gastroenterology

## 2019-03-25 DIAGNOSIS — B1910 Unspecified viral hepatitis B without hepatic coma: Secondary | ICD-10-CM

## 2019-03-27 ENCOUNTER — Ambulatory Visit
Admission: RE | Admit: 2019-03-27 | Discharge: 2019-03-27 | Disposition: A | Discharge status: Home / Self Care | Payer: BLUE CROSS/BLUE SHIELD | Source: Ambulatory Visit | Attending: Gastroenterology | Admitting: Gastroenterology

## 2019-03-27 ENCOUNTER — Other Ambulatory Visit: Payer: Self-pay

## 2019-03-27 DIAGNOSIS — B1910 Unspecified viral hepatitis B without hepatic coma: Secondary | ICD-10-CM

## 2020-05-12 ENCOUNTER — Other Ambulatory Visit: Payer: Self-pay | Admitting: Gastroenterology

## 2020-05-12 DIAGNOSIS — B181 Chronic viral hepatitis B without delta-agent: Secondary | ICD-10-CM

## 2020-05-14 ENCOUNTER — Ambulatory Visit: Payer: Self-pay | Admitting: Obstetrics

## 2020-05-26 ENCOUNTER — Ambulatory Visit
Admission: RE | Admit: 2020-05-26 | Discharge: 2020-05-26 | Disposition: A | Discharge status: Home / Self Care | Payer: 59 | Source: Ambulatory Visit | Attending: Gastroenterology | Admitting: Gastroenterology

## 2020-05-26 DIAGNOSIS — B181 Chronic viral hepatitis B without delta-agent: Secondary | ICD-10-CM

## 2020-07-03 ENCOUNTER — Encounter: Payer: Self-pay | Admitting: Dietician

## 2020-07-03 ENCOUNTER — Encounter: Payer: 59 | Attending: Gastroenterology | Admitting: Dietician

## 2020-07-03 ENCOUNTER — Other Ambulatory Visit: Payer: Self-pay

## 2020-07-03 DIAGNOSIS — K760 Fatty (change of) liver, not elsewhere classified: Secondary | ICD-10-CM | POA: Insufficient documentation

## 2020-07-03 NOTE — Patient Instructions (Signed)
   Remember to try to include vegetables, carbohydrates, and protein to create balanced lunches/dinners.   This will help make sure you get lots of fiber and lean protein!   Great job with eating a balanced breakfast!  Continue to drink lots of water every day, good job with this.   Start paying attention to the types of fat you are eating, and remember to avoid sources of saturated and trans fats (see handout.) Always grill/ broil/ bake/ saute foods rather than deep frying.

## 2020-07-03 NOTE — Progress Notes (Signed)
Medical Nutrition Therapy   Primary concerns today: weight management, fatty liver  Referral diagnosis: K76.0- fatty liver Preferred learning style: no preference indicated Learning readiness: ready   NUTRITION ASSESSMENT   Anthropometrics  Weight: 174.4 lbs    Clinical Medical Hx: hepatitis B Medications: vemlidy  Notable Signs/Symptoms: occasional bloating   Lifestyle & Dietary Hx Patient has hx of hepatitis B and states she does not feel any of the symptoms. Currently denies any stomach pain, diarrhea, constipation, and lack of appetite. States she has been trying to lose some weight especially since gaining over the course of the pandemic.  Typical meal pattern is 3-5 meals/snacks per day. In addition to oatmeal, almonds, fruit, chicken, eggs, and salad, may also have sweet potatoes and corn based foods. Avoids pork and red meat.   Estimated daily fluid intake: 3-4 water bottles Supplements: MVI  Sleep: 6-7 hours/night, sleeps "okay"  Current average weekly physical activity: gym 2x/week   24-Hr Dietary Recall First Meal: oatmeal + almonds + blueberries + 2 boiled eggs Snack: - Second Meal: rice  Snack: smoothie  Third Meal: caesar salad + chicken  Snack: - Beverages: water  Estimated Energy Needs Calories: 1800-2000 Carbohydrate: 200-225g Protein: 113-125g Fat: 60-67g   NUTRITION DIAGNOSIS  Food/nutrition related knowledge deficit (NB-1.1) related to imbalance of macronutrient intake as evidenced by patient reported dietary hx and unaware of need for fat restricted diet for fatty liver.    NUTRITION INTERVENTION  Nutrition education (E-1) on the following topics:   General balanced nutrition for weight management: balanced meals, portion control, and importance of protein, fiber, and water  Fat restricted diet for fatty liver  Handouts Provided Include   MyPlate Portions and Meal Ideas  Balanced Breakfasts   Fat Restricted Nutrition Therapy    Learning Style & Readiness for Change Teaching method utilized: Visual & Auditory  Demonstrated degree of understanding via: Teach Back  Barriers to learning/adherence to lifestyle change: None Identified   MONITORING & EVALUATION Dietary intake, weekly physical activity, and goals in 4-6 weeks.  Next Steps  Patient is to follow up at [facility] in 4-6 weeks.

## 2020-08-10 ENCOUNTER — Ambulatory Visit: Payer: 59 | Admitting: Dietician

## 2020-08-11 ENCOUNTER — Ambulatory Visit: Payer: 59 | Admitting: Dietician

## 2020-09-02 ENCOUNTER — Ambulatory Visit (HOSPITAL_COMMUNITY)
Admission: EM | Admit: 2020-09-02 | Discharge: 2020-09-02 | Disposition: A | Discharge status: Home / Self Care | Payer: 59

## 2020-09-02 ENCOUNTER — Other Ambulatory Visit: Payer: Self-pay

## 2020-09-03 ENCOUNTER — Ambulatory Visit (HOSPITAL_COMMUNITY)
Admission: EM | Admit: 2020-09-03 | Discharge: 2020-09-03 | Disposition: A | Discharge status: Home / Self Care | Payer: 59 | Attending: Family Medicine | Admitting: Family Medicine

## 2020-09-03 ENCOUNTER — Other Ambulatory Visit: Payer: Self-pay

## 2020-09-03 ENCOUNTER — Encounter (HOSPITAL_COMMUNITY): Payer: Self-pay | Admitting: Emergency Medicine

## 2020-09-03 DIAGNOSIS — S0993XA Unspecified injury of face, initial encounter: Secondary | ICD-10-CM

## 2020-09-03 DIAGNOSIS — H1132 Conjunctival hemorrhage, left eye: Secondary | ICD-10-CM

## 2020-09-03 MED ORDER — FLUORESCEIN SODIUM 1 MG OP STRP
ORAL_STRIP | OPHTHALMIC | Status: AC
  Filled 2020-09-03: qty 1

## 2020-09-03 MED ORDER — ERYTHROMYCIN 5 MG/GM OP OINT: TOPICAL_OINTMENT | OPHTHALMIC | 0 refills | Status: DC

## 2020-09-03 MED ORDER — TETRACAINE HCL 0.5 % OP SOLN
OPHTHALMIC | Status: AC
  Filled 2020-09-03: qty 4
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
## 2020-10-26 ENCOUNTER — Ambulatory Visit: Payer: 59 | Admitting: Obstetrics

## 2021-08-12 ENCOUNTER — Ambulatory Visit: Payer: 59 | Admitting: Obstetrics

## 2021-09-07 ENCOUNTER — Other Ambulatory Visit (HOSPITAL_COMMUNITY)
Admission: RE | Admit: 2021-09-07 | Discharge: 2021-09-07 | Disposition: A | Discharge status: Home / Self Care | Payer: 59 | Source: Ambulatory Visit | Attending: Obstetrics | Admitting: Obstetrics

## 2021-09-07 ENCOUNTER — Ambulatory Visit (INDEPENDENT_AMBULATORY_CARE_PROVIDER_SITE_OTHER): Payer: 59 | Admitting: Obstetrics

## 2021-09-07 ENCOUNTER — Encounter: Payer: Self-pay | Admitting: Obstetrics

## 2021-09-07 ENCOUNTER — Other Ambulatory Visit: Payer: Self-pay

## 2021-09-07 VITALS — BP 117/80 | HR 74 | Ht 61.0 in | Wt 175.0 lb

## 2021-09-07 DIAGNOSIS — Z124 Encounter for screening for malignant neoplasm of cervix: Secondary | ICD-10-CM | POA: Diagnosis present

## 2021-09-07 DIAGNOSIS — N898 Other specified noninflammatory disorders of vagina: Secondary | ICD-10-CM | POA: Insufficient documentation

## 2021-09-07 DIAGNOSIS — N852 Hypertrophy of uterus: Secondary | ICD-10-CM

## 2021-09-07 DIAGNOSIS — Z01419 Encounter for gynecological examination (general) (routine) without abnormal findings: Secondary | ICD-10-CM

## 2021-09-07 DIAGNOSIS — N946 Dysmenorrhea, unspecified: Secondary | ICD-10-CM

## 2021-09-07 MED ORDER — IBUPROFEN 800 MG PO TABS: 800.0000 mg | ORAL_TABLET | Freq: Three times a day (TID) | ORAL | 5 refills | Status: DC | PRN
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
## 2021-09-07 NOTE — Progress Notes (Signed)
Patient presents for Annual Exam.  LMP: 08/10/21 cycles last 5 days, and heavy flow. Last pap:02/28/2017 WNL  Contraception:BTL  STD Screening:just for BV /yeast. Family Hx of Breast Cancer: None  CC: Heavy periods/irregular.vaginal itching

## 2021-09-08 LAB — CERVICOVAGINAL ANCILLARY ONLY
Bacterial Vaginitis (gardnerella): NEGATIVE
Candida Glabrata: NEGATIVE
Candida Vaginitis: NEGATIVE
Chlamydia: NEGATIVE
Comment: NEGATIVE
Comment: NEGATIVE
Comment: NEGATIVE
Comment: NEGATIVE
Comment: NEGATIVE
Comment: NORMAL
Neisseria Gonorrhea: NEGATIVE
Trichomonas: NEGATIVE

## 2021-09-09 NOTE — Progress Notes (Signed)
TC to patient to notify of RX for terazol. No answer. No VM available.

## 2021-09-10 LAB — CYTOLOGY - PAP
Comment: NEGATIVE
Diagnosis: NEGATIVE
High risk HPV: NEGATIVE

## 2021-09-13 ENCOUNTER — Other Ambulatory Visit: Payer: Self-pay

## 2021-09-13 ENCOUNTER — Ambulatory Visit (HOSPITAL_BASED_OUTPATIENT_CLINIC_OR_DEPARTMENT_OTHER)
Admission: RE | Admit: 2021-09-13 | Discharge: 2021-09-13 | Disposition: A | Discharge status: Home / Self Care | Payer: 59 | Source: Ambulatory Visit | Attending: Obstetrics | Admitting: Obstetrics

## 2021-09-13 DIAGNOSIS — N852 Hypertrophy of uterus: Secondary | ICD-10-CM | POA: Diagnosis present

## 2021-10-06 ENCOUNTER — Encounter: Payer: Self-pay | Admitting: Obstetrics

## 2021-10-06 ENCOUNTER — Telehealth (INDEPENDENT_AMBULATORY_CARE_PROVIDER_SITE_OTHER): Payer: 59 | Admitting: Obstetrics

## 2021-10-06 DIAGNOSIS — N946 Dysmenorrhea, unspecified: Secondary | ICD-10-CM | POA: Diagnosis not present

## 2021-10-06 DIAGNOSIS — N852 Hypertrophy of uterus: Secondary | ICD-10-CM | POA: Diagnosis not present

## 2021-10-06 DIAGNOSIS — D219 Benign neoplasm of connective and other soft tissue, unspecified: Secondary | ICD-10-CM | POA: Diagnosis not present

## 2021-10-06 MED ORDER — IBUPROFEN 800 MG PO TABS: 800.0000 mg | ORAL_TABLET | Freq: Three times a day (TID) | ORAL | 5 refills | Status: AC | PRN

## 2021-10-06 NOTE — Progress Notes (Signed)
GYNECOLOGY VIRTUAL VISIT ENCOUNTER NOTE  Provider location: [facility]   Patient location: Home  I connected with Rilyn Scroggs Osei-Bonsu on 10/06/21 at  8:55 AM EDT by MyChart Video Encounter and verified that I am speaking with the correct person using two identifiers.   I discussed the limitations, risks, security and privacy concerns of performing an evaluation and management service virtually and the availability of in person appointments. I also discussed with the patient that there may be a patient responsible charge related to this service. The patient expressed understanding and agreed to proceed.   History:  GRENDA LORA is a 35 y.o. [id number] female being evaluated today for follow up from ultrasound for uterine fibroids and heavy and painful periods. She denies any abnormal vaginal discharge, bleeding, pelvic pain or other concerns.       Past Medical History:  Diagnosis Date   Chronic type B viral hepatitis ([place])    Medical history non-contributory    Varicose veins    Past Surgical History:  Procedure Laterality Date   CESAREAN SECTION     x 2    CESAREAN SECTION WITH BILATERAL TUBAL LIGATION Bilateral 01/06/2014   Procedure: CESAREAN SECTION WITH BILATERAL TUBAL LIGATION;  Surgeon: Lahoma Crocker, MD;  Location: [facility];  Service: Obstetrics;  Laterality: Bilateral;   ENDOVENOUS ABLATION SAPHENOUS VEIN W/ LASER Right 05-14-2015   endovenous laser ablation right greater saphenous vein and stab phlebectomy right leg by Curt Jews MD   The following portions of the patient's history were reviewed and updated as appropriate: allergies, current medications, past family history, past medical history, past social history, past surgical history and problem list.   Health Maintenance:  Normal pap and negative HRHPV on 09-07-2021.    Review of Systems:  Pertinent items noted in HPI and remainder of comprehensive ROS otherwise  negative.  Physical Exam:   General:  Alert, oriented and cooperative. Patient appears to be in no acute distress.  Mental Status: Normal mood and affect. Normal behavior. Normal judgment and thought content.   Respiratory: Normal respiratory effort, no problems with respiration noted  Rest of physical exam deferred due to type of encounter  Labs and Imaging No results found for this or any previous visit (from the past 336 hour(s)). US PELVIC COMPLETE WITH TRANSVAGINAL  Result Date: 09/13/2021 CLINICAL DATA:  Enlarged uterus question uterine fibroids, heavy menses, LMP 09/12/2021 EXAM: TRANSABDOMINAL AND TRANSVAGINAL ULTRASOUND OF PELVIS TECHNIQUE: Both transabdominal and transvaginal ultrasound examinations of the pelvis were performed. Transabdominal technique was performed for global imaging of the pelvis including uterus, ovaries, adnexal regions, and pelvic cul-de-sac. It was necessary to proceed with endovaginal exam following the transabdominal exam to visualize the endometrium and ovaries. COMPARISON:  None FINDINGS: Uterus Measurements: 11.2 x 5.7 x 6.3 cm = volume: 210 mL. Anteverted. Slightly heterogeneous myometrium. Anterior wall Caesarean section scar. Intramural leiomyoma anterior uterus 3.1 x 2.7 x 2.5 cm. Additional anterior wall leiomyoma intramural 1.4 x 1.6 x 1.4 cm. Probable exophytic leiomyoma at uterine fundus 5.9 x 6.9 x 6.4 cm. Endometrium Thickness: 8 mm.  No endometrial fluid or focal abnormality Right ovary Not visualized, likely obscured by bowel Left ovary Not visualized, likely obscured by bowel Other findings No free pelvic fluid.  No adnexal masses. IMPRESSION: Three probable uterine leiomyomata, including an exophytic 6.9 cm diameter suspected leiomyoma at uterine fundus. Unremarkable endometrial complex with nonvisualization of ovaries. Electronically Signed   By: Crist Infante.D.  On: 09/13/2021 12:46       Assessment and Plan:     1. Leiomyoma - surgical  options discussed.  All questions answered to patient's satisfaction.  2. Large uterus  3. Dysmenorrhea Rx: - ibuprofen (ADVIL) 800 MG tablet; Take 1 tablet (800 mg total) by mouth every 8 (eight) hours as needed.  Dispense: 30 tablet; Refill: 5  - take Ibuprofen q 8 hrs during menses, monthly      I discussed the assessment and treatment plan with the patient. The patient was provided an opportunity to ask questions and all were answered. The patient agreed with the plan and demonstrated an understanding of the instructions.   The patient was advised to call back or seek an in-person evaluation/go to the ED if the symptoms worsen or if the condition fails to improve as anticipated.  I have spent a total of 20 minutes of non-face-to-face time, excluding clinical staff time, reviewing notes and preparing to see patient, ordering tests and/or medications, and counseling the patient.    Baltazar Najjar, MD [facility]  10/06/21

## 2021-10-06 NOTE — Progress Notes (Signed)
Virtual Visit via Telephone Note  I connected with Glenda [name] on 10/06/21 at  8:55 AM EDT by telephone and verified that I am speaking with the correct person using two identifiers.  Pt following up after pelvic u/s for large uterus, heavy cycles, and dysmenorrhea.

## 2021-11-24 ENCOUNTER — Encounter: Payer: Self-pay | Admitting: Obstetrics and Gynecology

## 2021-11-24 ENCOUNTER — Other Ambulatory Visit: Payer: Self-pay

## 2021-11-24 ENCOUNTER — Ambulatory Visit (INDEPENDENT_AMBULATORY_CARE_PROVIDER_SITE_OTHER): Payer: Self-pay | Admitting: Obstetrics and Gynecology

## 2021-11-24 VITALS — BP 141/89 | HR 62 | Ht 62.0 in | Wt 176.7 lb

## 2021-11-24 DIAGNOSIS — D219 Benign neoplasm of connective and other soft tissue, unspecified: Secondary | ICD-10-CM | POA: Insufficient documentation

## 2021-11-24 DIAGNOSIS — N920 Excessive and frequent menstruation with regular cycle: Secondary | ICD-10-CM

## 2021-11-24 NOTE — Progress Notes (Signed)
Glenda [name] presents in referral from Dr Jodi Mourning for eval of hyst due to uterine fibroids and heavy cycles. Pt reports monthly cycles, last 5-7 days, heavy with cramps and clots. GYN U/S exophytic fundal fibroid @ 7 cm and several other fibroids @ 3 cm Pap smear UTD C/S x 3 BTL  PE AF VSS  Chaperone present for exam  Lungs clear Heart RRR Abd soft + BS GU Nl EGBUS, uterus mobile, slightly tender @ 12 weeks, no masses  A/P Uterine fibroids         Menorrhagia  Tx options reviewed with pt. Pt desires definite therapy. TVH with possible salpingectomy reviewed with pt. Pt to sign hyst papers today. Will need to complete financial aid before surgery is schedule. Pt to notify office once financial aid has been approved, F/U with post op appt.

## 2021-11-24 NOTE — Patient Instructions (Signed)
Vaginal Hysterectomy A vaginal hysterectomy is a procedure to remove all or part of the uterus through a small incision in the vagina. In this procedure, your health care provider may remove your entire uterus, including the cervix. The cervix is the opening and bottom part of the uterus and is located between the vagina and the uterus. Sometimes, the ovaries and fallopian tubes are also removed. This surgery may be done to treat problems such as: Noncancerous growths in the uterus (uterine fibroids) that cause symptoms. A condition that causes the lining of the uterus to grow in other areas (endometriosis). Problems with pelvic support. Cancer of the cervix, ovaries, uterus, or tissue that lines the uterus (endometrium). Excessive bleeding in the uterus. When removing your uterus, your health care provider may also remove the ovaries and the fallopian tubes. After this procedure, you will no longer be able to have a baby, and you will no longer have a menstrual period. Tell a health care provider about: Any allergies you have. All medicines you are taking, including vitamins, herbs, eye drops, creams, and over-the-counter medicines. Any problems you or family members have had with anesthetic medicines. Any blood disorders you have. Any surgeries you have had. Any medical conditions you have. Whether you are pregnant or may be pregnant. What are the risks? Generally, this is a safe procedure. However, problems may occur, including: Bleeding. Infection. Blood clots in the legs or lungs. Damage to nearby structures or organs. Pain during sex. Allergic reactions to medicines. What happens before the procedure? Staying hydrated Follow instructions from your health care provider about hydration, which may include: Up to 2 hours before the procedure - you may continue to drink clear liquids, such as water, clear fruit juice, black coffee, and plain tea.  Eating and drinking  restrictions Follow instructions from your health care provider about eating and drinking, which may include: 8 hours before the procedure - stop eating heavy meals or foods, such as meat, fried foods, or fatty foods. 6 hours before the procedure - stop eating light meals or foods, such as toast or cereal. 6 hours before the procedure - stop drinking milk or drinks that contain milk. 2 hours before the procedure - stop drinking clear liquids. Medicines Ask your health care provider about: Changing or stopping your regular medicines. This is especially important if you are taking diabetes medicines or blood thinners. Taking medicines such as aspirin and ibuprofen. These medicines can thin your blood. Do not take these medicines unless your health care provider tells you to take them. Taking over-the-counter medicines, vitamins, herbs, and supplements. You may be asked to take a medicine to empty your colon (bowel preparation). General instructions If you were asked to do a bowel preparation before the procedure, follow instructions from your health care provider. This procedure can affect the way you feel about yourself. Talk with your health care provider about the physical and emotional changes hysterectomy may cause. Do not use any products that contain nicotine or tobacco for at least 4 weeks before the procedure. These products include cigarettes, e-cigarettes, and chewing tobacco. If you need help quitting, ask your health care provider. Plan to have a responsible adult take you home from the hospital or clinic. Plan to have a responsible adult care for you for the time you are told after you leave the hospital or clinic. This is important. Surgery safety Ask your health care provider: How your surgery site will be marked. What steps will be taken to  help prevent infection. These may include: Removing hair at the surgery site. Washing skin with a germ-killing soap. Receiving antibiotic  medicine. What happens during the procedure? An IV will be inserted into one of your veins. You will be given one or more of the following: A medicine to help you relax (sedative). A medicine to numb the area (local anesthetic). A medicine to make you fall asleep (general anesthetic). A medicine that is injected into your spine to numb the area below and slightly above the injection site (spinal anesthetic). A medicine that is injected into an area of your body to numb everything below the injection site (regional anesthetic). Your surgeon will make an incision in your vagina. Your surgeon will locate and remove all or part of your uterus. Part or all of the uterus will be removed through the vagina. Your ovaries and fallopian tubes may be removed at the same time. The incision in your vagina will be closed with stitches (sutures) that dissolve over time. The procedure may vary among health care providers and hospitals. What happens after the procedure? Your blood pressure, heart rate, breathing rate, and blood oxygen level will be monitored until you leave the hospital or clinic. You will be encouraged to walk as soon as possible. You will also use a device or do breathing exercises to keep your lungs clear. You may have to wear compression stockings. These stockings help to prevent blood clots and reduce swelling in your legs. You will be given pain medicine as needed. You will need to wear a sanitary pad for vaginal discharge or bleeding. Summary A vaginal hysterectomy is a procedure to remove all or part of the uterus through the vagina. You may need a vaginal hysterectomy to treat a variety of abnormalities of the uterus. Plan to have a responsible adult take you home from the hospital or clinic. Plan to have a responsible adult care for you for the time you are told after you leave the hospital or clinic. This is important. This information is not intended to replace advice given to  you by your health care provider. Make sure you discuss any questions you have with your health care provider. Document Revised: 08/14/2020 Document Reviewed: 08/14/2020 Elsevier Patient Education  [place].

## 2021-11-24 NOTE — Progress Notes (Signed)
GYN surgical consult. Recent pelvic US with multiple leiomyomas

## 2022-02-19 IMAGING — US US PELVIS COMPLETE WITH TRANSVAGINAL
1 series · 13 of 25 positions shown · non-contrast
Comparison: None

CLINICAL DATA: Enlarged uterus question uterine fibroids, heavy
menses, LMP 09/12/2021

EXAM:
TRANSABDOMINAL AND TRANSVAGINAL ULTRASOUND OF PELVIS
TECHNIQUE: Both transabdominal and transvaginal ultrasound examinations of the
pelvis were performed. Transabdominal technique was performed for
global imaging of the pelvis including uterus, ovaries, adnexal
regions, and pelvic cul-de-sac. It was necessary to proceed with
endovaginal exam following the transabdominal exam to visualize the
endometrium and ovaries.

[Series 1: us pelvic complete with transvaginal · 13 of 71 slices shown]
[im 1/71]
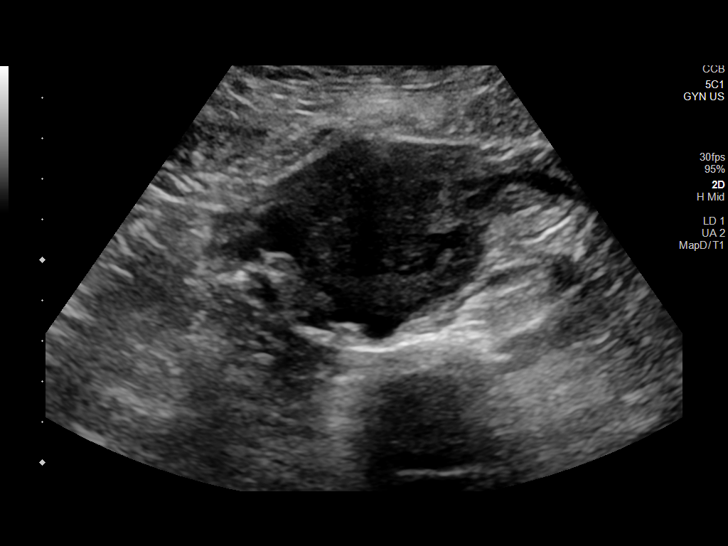
[im 6/71]
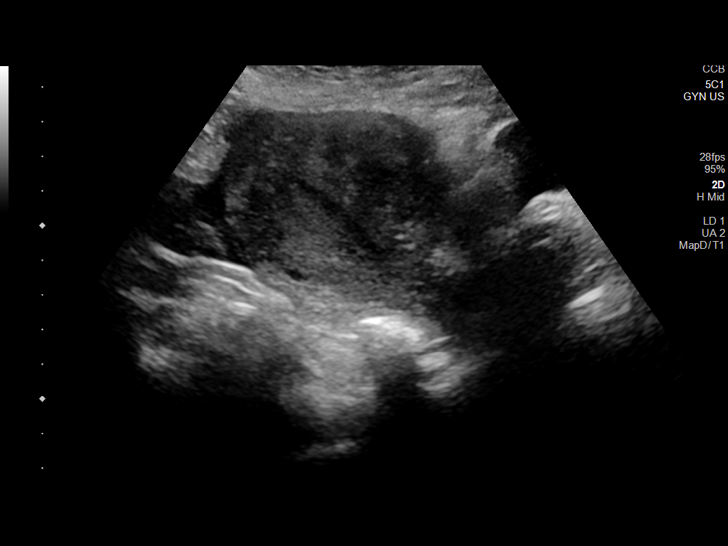
[im 12/71]
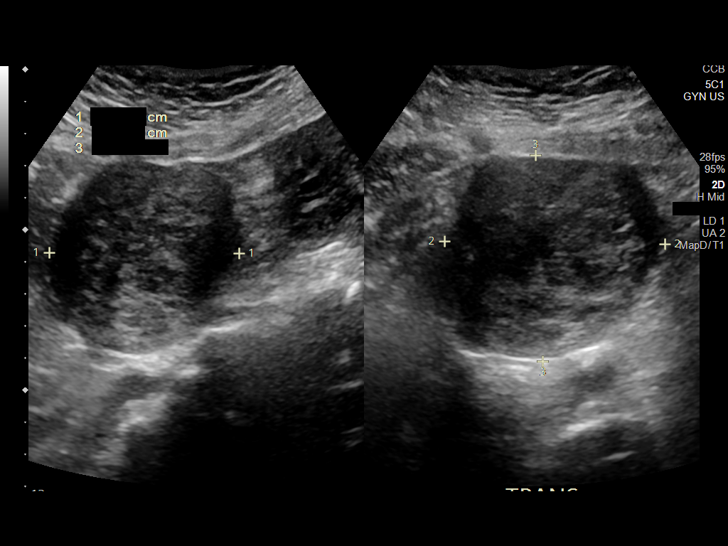
[im 18/71]
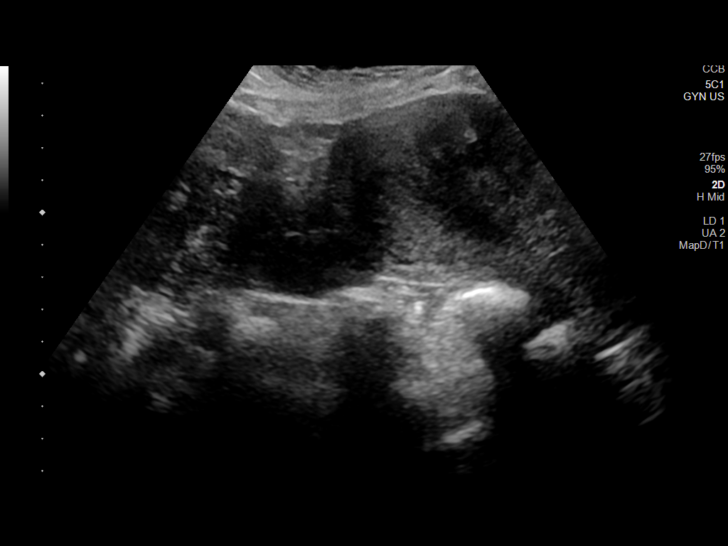
[im 24/71]
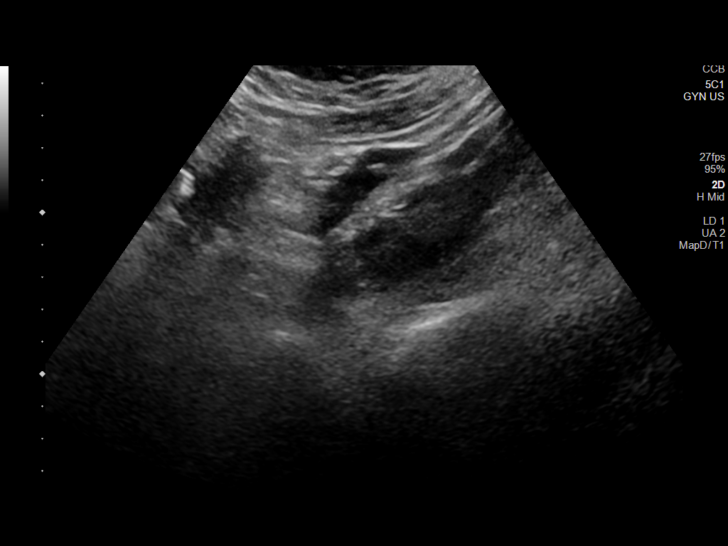
[im 30/71]
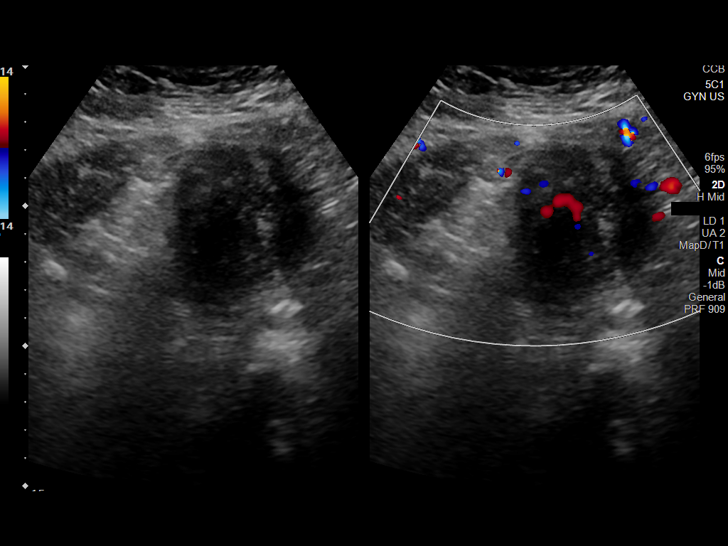
[im 36/71]
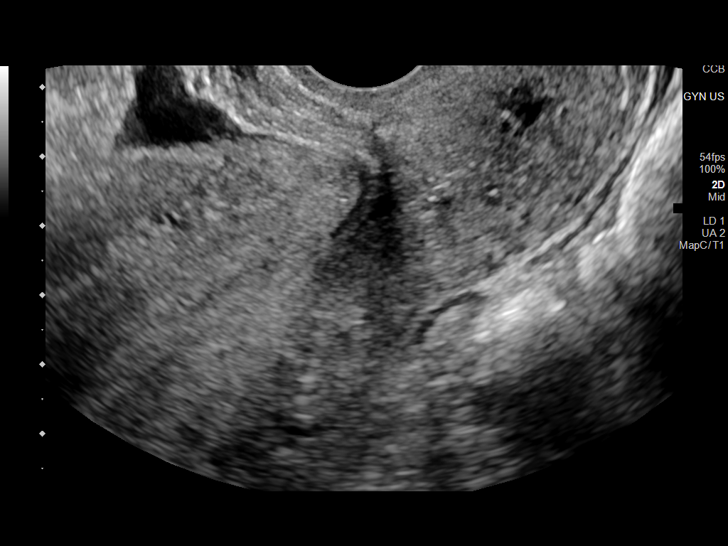
[im 41/71]
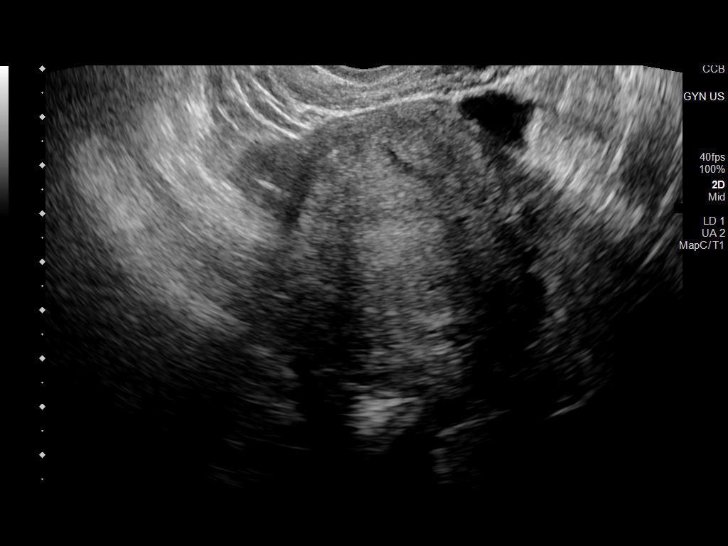
[im 47/71]
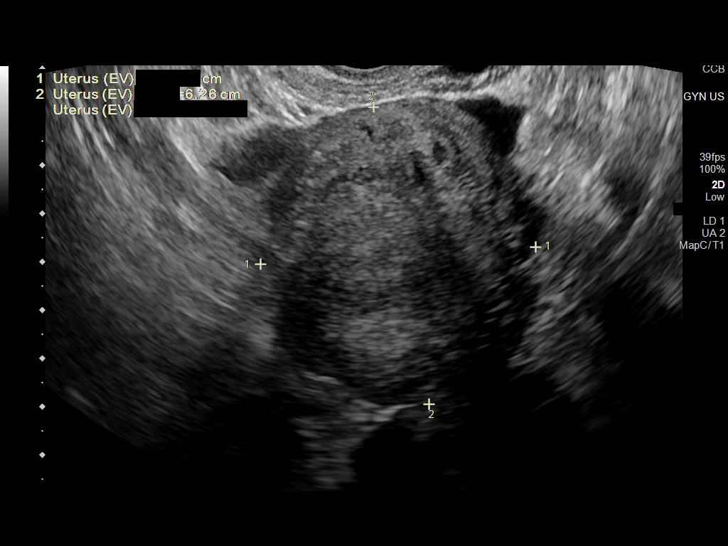
[im 53/71]
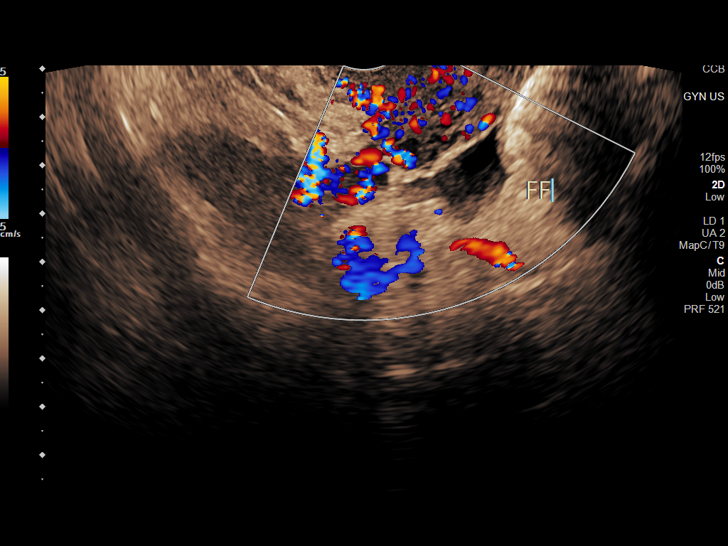
[im 59/71]
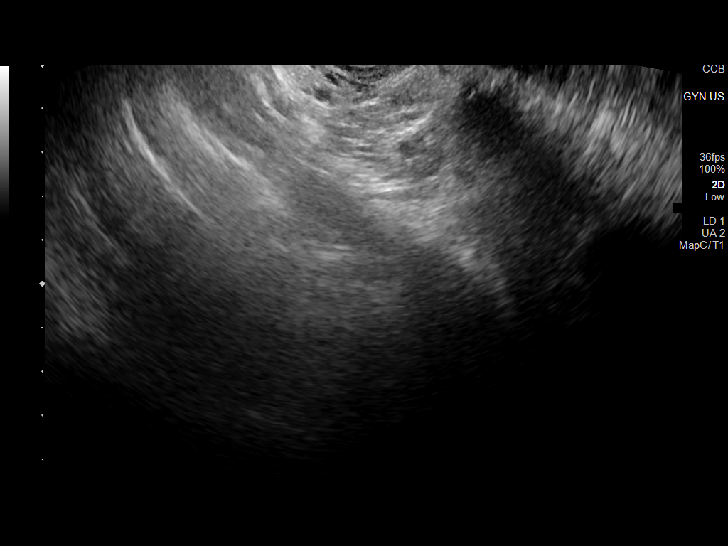
[im 65/71]
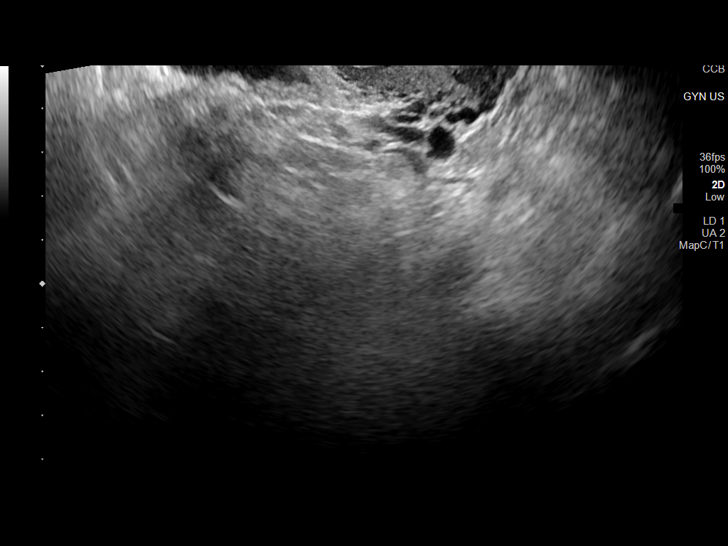
[im 71/71]
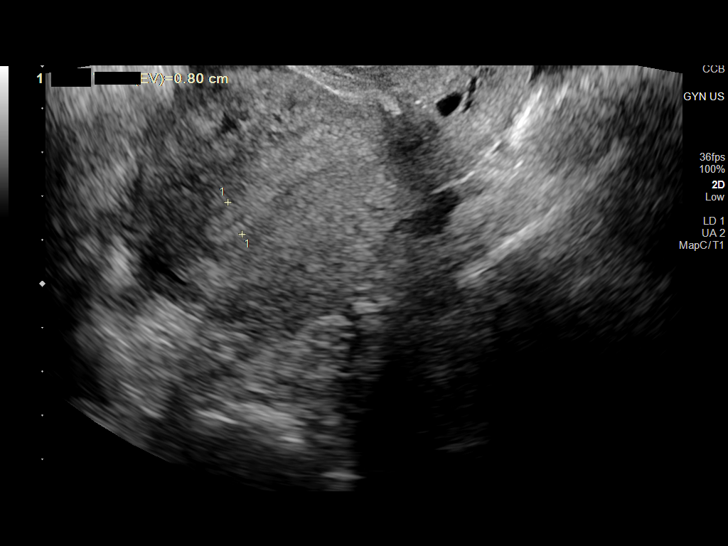

[13 of 25 positions shown; findings below may reference images not displayed]

FINDINGS: Uterus

Measurements: 11.2 x 5.7 x 6.3 cm = volume: 210 mL. Anteverted.
Slightly heterogeneous myometrium. Anterior wall Caesarean section
scar. Intramural leiomyoma anterior uterus 3.1 x 2.7 x 2.5 cm.
Additional anterior wall leiomyoma intramural 1.4 x 1.6 x 1.4 cm.
Probable exophytic leiomyoma at uterine fundus 5.9 x 6.9 x 6.4 cm.

Endometrium

Thickness: 8 mm.  No endometrial fluid or focal abnormality

Right ovary

Not visualized, likely obscured by bowel

Left ovary

Not visualized, likely obscured by bowel

Other findings

No free pelvic fluid.  No adnexal masses.
IMPRESSION: Three probable uterine leiomyomata, including an exophytic 6.9 cm
diameter suspected leiomyoma at uterine fundus.

Unremarkable endometrial complex with nonvisualization of ovaries.

## 2022-02-26 ENCOUNTER — Emergency Department (HOSPITAL_COMMUNITY)
Admission: EM | Admit: 2022-02-26 | Discharge: 2022-02-26 | Disposition: A | Discharge status: Home / Self Care | Payer: Worker's Compensation | Attending: Emergency Medicine | Admitting: Emergency Medicine

## 2022-02-26 ENCOUNTER — Encounter (HOSPITAL_COMMUNITY): Payer: Self-pay | Admitting: Emergency Medicine

## 2022-02-26 ENCOUNTER — Other Ambulatory Visit: Payer: Self-pay

## 2022-02-26 ENCOUNTER — Emergency Department (HOSPITAL_COMMUNITY): Payer: Worker's Compensation

## 2022-02-26 DIAGNOSIS — W208XXA Other cause of strike by thrown, projected or falling object, initial encounter: Secondary | ICD-10-CM | POA: Insufficient documentation

## 2022-02-26 DIAGNOSIS — Y990 Civilian activity done for income or pay: Secondary | ICD-10-CM | POA: Diagnosis not present

## 2022-02-26 DIAGNOSIS — Y92129 Unspecified place in nursing home as the place of occurrence of the external cause: Secondary | ICD-10-CM | POA: Diagnosis not present

## 2022-02-26 DIAGNOSIS — S99922A Unspecified injury of left foot, initial encounter: Secondary | ICD-10-CM | POA: Insufficient documentation

## 2022-02-26 NOTE — Discharge Instructions (Signed)
Your x-ray did not show any evidence of broken bones or dislocations.  You likely have a soft tissue injury to your foot.  I recommend Tylenol, Motrin as needed for pain.  Make sure to ice, elevate the foot.  Try to stay off of it over the next 24 hours to give your foot a rest.  Gradually increase walking.  I have written you for a work note.  Follow-up with primary care provider over the next few days if your symptoms do not improve ?

## 2022-02-26 NOTE — ED Triage Notes (Signed)
L great toe pain.  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 ?

## 2022-02-26 NOTE — ED Provider Notes (Signed)
?[facility] ?Provider Note ? ? ?CSN: [id number] ?Arrival date & time: 02/26/22  1811 ? ?  ? ?History ? ?Chief Complaint  ?Patient presents with  ? Toe Pain  ? ? ?Glenda [name] is a 36 y.o. female here for evaluation of great toe pain.  Works at a nursing facility.  Battery from [place] fell off landing on her left great toe.  Has had pain at DIP since.  She has been ambulatory without difficulty.  No laceration, nailbed injury.  Rates pain a 6/10.  No fever, swelling, numbness, weakness. ? ?HPI ? ?  ? ?Home Medications ?Prior to Admission medications   ?Medication Sig Start Date End Date Taking? Authorizing Provider  ?ibuprofen (ADVIL) 800 MG tablet Take 1 tablet (800 mg total) by mouth every 8 (eight) hours as needed. 10/06/21   Shelly Bombard, MD  ?   ? ?Allergies    ?Patient has no known allergies.   ? ?Review of Systems   ?Review of Systems  ?Constitutional: Negative.   ?HENT: Negative.    ?Respiratory: Negative.    ?Cardiovascular: Negative.   ?Gastrointestinal: Negative.   ?Genitourinary: Negative.   ?Musculoskeletal:   ?     Left great toe pain at DIP  ?Skin: Negative.   ?Neurological: Negative.   ?All other systems reviewed and are negative. ? ?Physical Exam ?Updated Vital Signs ?BP 132/82 (BP Location: Right Arm)   Pulse 82   Temp 99 ?F (37.2 ?C) (Oral)   Resp 16   Ht '5\' 2"'$  (1.575 m)   Wt 76.7 kg   LMP 02/12/2022   SpO2 100%   BMI 30.91 kg/m?  ?Physical Exam ?Vitals and nursing note reviewed.  ?Constitutional:   ?   General: She is not in acute distress. ?   Appearance: She is well-developed. She is not ill-appearing, toxic-appearing or diaphoretic.  ?HENT:  ?   Head: Normocephalic and atraumatic.  ?Eyes:  ?   Pupils: Pupils are equal, round, and reactive to light.  ?Cardiovascular:  ?   Rate and Rhythm: Normal rate.  ?   Pulses:     ?     Dorsalis pedis pulses are 2+ on the right side and 2+ on the left side.  ?     Posterior tibial pulses are 2+ on  the right side and 2+ on the left side.  ?Pulmonary:  ?   Effort: No respiratory distress.  ?Abdominal:  ?   General: There is no distension.  ?Musculoskeletal:     ?   General: Normal range of motion.  ?   Cervical back: Normal range of motion.  ?   Right foot: Normal.  ?   Left foot: Tenderness present.  ?     Feet: ? ?   Comments: Tenderness left great toe DIP without any overlying skin changes.  Able to flex and extend, wiggle digits without difficulty.  No bony tenderness to proximal/mid foot.  Able to plantarflex and dorsiflex at ankles without difficulty.  Nontender bilateral tib-fib  ?Skin: ?   General: Skin is warm and dry.  ?   Capillary Refill: Capillary refill takes less than 2 seconds.  ?   Comments: No subungual hematoma.  No edema, erythema or warmth.  No lacerations, contusions or abrasions.  ?Neurological:  ?   General: No focal deficit present.  ?   Mental Status: She is alert.  ?   Cranial Nerves: Cranial nerves 2-12 are intact.  ?  Sensory: Sensation is intact.  ?   Motor: Motor function is intact.  ?   Gait: Gait is intact.  ?   Comments: Ambulatory ?Intact sensation  ?Psychiatric:     ?   Mood and Affect: Mood normal.  ? ? ?ED Results / Procedures / Treatments   ?Labs ?(all labs ordered are listed, but only abnormal results are displayed) ?Labs Reviewed - No data to display ? ?EKG ?None ? ?Radiology ?DG Foot Complete Left ? ?Result Date: 02/26/2022 ?CLINICAL DATA:  Battery fell on great toe. EXAM: LEFT FOOT - COMPLETE 3+ VIEW COMPARISON:  None. FINDINGS: There is no evidence of fracture or dislocation. There is no evidence of arthropathy or other focal bone abnormality. Soft tissue swelling about the dorsal aspect of the great toe. IMPRESSION: No evidence of fracture or dislocation Electronically Signed   By: Keane Police D.O.   On: 02/26/2022 18:59   ? ?Procedures ?Procedures  ? ? ?Medications Ordered in ED ?Medications - No data to display ? ?ED Course/ Medical Decision Making/ A&P ?   ? ?36 year old here for evaluation of left great toe pain at DIP after battery from her left fell onto toe while at work.  She is ambulatory, neurovascularly intact.  She is afebrile, nonseptic, not ill-appearing.  She has no nailbed injury.  Does have some tenderness without any overlying erythema, warmth, ecchymosis.  No lacerations, contusions or abrasions.  Nontender to proximal/mid foot as well as ankle and tib-fib. ? ?Imaging personally viewed and interpreted ? ?No acute fracture, dislocation of the left foot ? ?Discussed results with patient.  Suspect soft tissue injury.  Discussed RICE for symptomatic management, follow-up with PCP if symptoms do not resolve.  She is agreeable.  Low suspicion for acute infectious process, gout, occult fracture, dislocation, injury requiring suturing, trephination.  Compartments are soft ? ?The patient has been appropriately medically screened and/or stabilized in the ED. I have low suspicion for any other emergent medical condition which would require further screening, evaluation or treatment in the ED or require inpatient management. ? ?Patient is hemodynamically stable and in no acute distress.  Patient able to ambulate in department prior to ED.  Evaluation does not show acute pathology that would require ongoing or additional emergent interventions while in the emergency department or further inpatient treatment.  I have discussed the diagnosis with the patient and answered all questions.  Pain is been managed while in the emergency department and patient has no further complaints prior to discharge.  Patient is comfortable with plan discussed in room and is stable for discharge at this time.  I have discussed strict return precautions for returning to the emergency department.  Patient was encouraged to follow-up with PCP/specialist refer to at discharge.  ? ?                        ?Medical Decision Making ?Amount and/or Complexity of Data Reviewed ?Radiology:  ordered and independent interpretation performed. Decision-making details documented in ED Course. ? ?Risk ?OTC drugs. ?Risk Details: Do not feel patient needs additional labs, imaging, hospitalization at this time ? ? ? ? ? ? ? ? ?Final Clinical Impression(s) / ED Diagnoses ?Final diagnoses:  ?Injury of toe on left foot, initial encounter  ? ? ?Rx / DC Orders ?ED Discharge Orders   ? ? None  ? ?  ? ? ?  ?Laqueena Hinchey A, PA-C ?02/26/22 1930 ? ?  ?Wyvonnia Dusky, MD ?02/27/22  1016 ? ?

## 2022-04-29 ENCOUNTER — Encounter (HOSPITAL_COMMUNITY): Payer: Self-pay

## 2022-04-29 ENCOUNTER — Emergency Department (HOSPITAL_COMMUNITY)
Admission: EM | Admit: 2022-04-29 | Discharge: 2022-04-29 | Discharge status: Against Medical Advice | Payer: Self-pay | Attending: Emergency Medicine | Admitting: Emergency Medicine

## 2022-04-29 ENCOUNTER — Encounter (HOSPITAL_COMMUNITY): Payer: Self-pay | Admitting: Emergency Medicine

## 2022-04-29 ENCOUNTER — Other Ambulatory Visit: Payer: Self-pay

## 2022-04-29 ENCOUNTER — Emergency Department (HOSPITAL_COMMUNITY)
Admission: EM | Admit: 2022-04-29 | Discharge: 2022-04-30 | Disposition: A | Discharge status: Home / Self Care | Payer: Self-pay | Source: Home / Self Care | Attending: Emergency Medicine | Admitting: Emergency Medicine

## 2022-04-29 DIAGNOSIS — Y990 Civilian activity done for income or pay: Secondary | ICD-10-CM | POA: Insufficient documentation

## 2022-04-29 DIAGNOSIS — M79672 Pain in left foot: Secondary | ICD-10-CM | POA: Insufficient documentation

## 2022-04-29 DIAGNOSIS — Z5321 Procedure and treatment not carried out due to patient leaving prior to being seen by health care provider: Secondary | ICD-10-CM | POA: Diagnosis not present

## 2022-04-29 DIAGNOSIS — W208XXA Other cause of strike by thrown, projected or falling object, initial encounter: Secondary | ICD-10-CM | POA: Insufficient documentation

## 2022-04-29 NOTE — ED Triage Notes (Signed)
Pt here for follow up for foot pain. Left foot is swollen. Pt had injury to foot at work [employer] and is still having pain. Pt is ambulatory ?

## 2022-04-29 NOTE — ED Provider Triage Note (Signed)
Emergency Medicine Provider Triage Evaluation Note ? ?Glenda [name] , a 36 y.o. female  was evaluated in triage.  Pt complains of left foot pain since a charger for a [employer] lift fell on at the beginning of March.  Patient initially presented to ED on March 4 for injury, had negative work-up to include x-ray imaging.  The patient is had persistent pain since this time, has had transient relief of pain with ibuprofen however pain always returns.  Patient ambulatory to triage. ? ?Review of Systems  ?Positive:  ?Negative:  ? ?Physical Exam  ?BP (!) 127/91 (BP Location: Right Arm)   Pulse 67   Temp 98.7 ?F (37.1 ?C) (Oral)   Resp 16   SpO2 100%  ?Gen:   Awake, no distress   ?Resp:  Normal effort  ?MSK:   Moves extremities without difficulty  ?Other:  Slight soft tissue swelling noted to left-sided great MTP however patient has forage motion. ? ?Medical Decision Making  ?Medically screening exam initiated at 12:28 PM.  Appropriate orders placed.  Glenda [name] was informed that the remainder of the evaluation will be completed by another provider, this initial triage assessment does not replace that evaluation, and the importance of remaining in the ED until their evaluation is complete. ? ? ?  ?Azucena Cecil, PA-C ?04/29/22 1230 ? ?

## 2022-04-29 NOTE — ED Notes (Signed)
Pt no longer wanted to wait  

## 2022-04-29 NOTE — ED Provider Triage Note (Signed)
Emergency Medicine Provider Triage Evaluation Note ? ?Glenda [name] , a 36 y.o. female  was evaluated in triage.  Pt complains of left foot pain.  Patient was triaged by me this morning however she had to leave to get her kids for the bus stop.  Patient returns complaining of the same.  Patient presented to ED in March for for injury, had negative work-up including x-ray imaging.  Patient had [employer] fall in her left foot 1 month ago at work.  Patient having persistent pain since this time.  Patient ambulatory to triage. ? ?Review of Systems  ?Positive:  ?Negative:  ? ?Physical Exam  ?BP 131/82   Pulse 79   Temp 98.4 ?F (36.9 ?C) (Oral)   Resp 16   Ht '5\' 2"'$  (1.575 m)   Wt 77.1 kg   LMP 04/18/2022   SpO2 98%   BMI 31.09 kg/m?  ?Gen:   Awake, no distress   ?Resp:  Normal effort  ?MSK:   Moves extremities without difficulty  ?Other:   ? ?Medical Decision Making  ?Medically screening exam initiated at 8:28 PM.  Appropriate orders placed.  Glenda [name] was informed that the remainder of the evaluation will be completed by another provider, this initial triage assessment does not replace that evaluation, and the importance of remaining in the ED until their evaluation is complete. ? ? ?  ?Azucena Cecil, PA-C ?04/29/22 2029 ? ?

## 2022-04-29 NOTE — ED Triage Notes (Signed)
Patient here with complaint of continued left foot pain after an accident at work two months ago. Patient was seen for same at time of incident, x-ray showed no fracture. Patient has not had injury evaluated since March 2023, but patient states pain has not resolved. Patient is alert, oriented, and in no apparent distress at this time. ?

## 2022-04-30 MED ORDER — DICLOFENAC SODIUM 1 % EX GEL: 2.0000 g | Freq: Four times a day (QID) | CUTANEOUS | 1 refills | Status: AC

## 2022-04-30 NOTE — ED Notes (Signed)
Patient discharge instructions reviewed with the patient, the patient verbalized understanding of instructions. Patient discharged. ?

## 2022-04-30 NOTE — ED Provider Notes (Signed)
?[facility] ?Provider Note ? ? ?CSN: [id number] ?Arrival date & time: 04/29/22  1929 ? ?  ? ?History ? ?Chief Complaint  ?Patient presents with  ? Foot Pain  ? ? ?Glenda [name] is a 36 y.o. female. ? ?Pt reports she injured her foot 2 months ago.  Pt reports she continues to have a throbbing pain.  Pt reports a battery fell on her foot.  Pt states seh still has pain. Pt request a cream to put on her foot.  Pt reports she has pain when she works.  ? ?The history is provided by the patient. No language interpreter was used.  ?Foot Pain ?This is a recurrent problem. Episode onset: 2 months. The problem has not changed since onset.Pertinent negatives include no chest pain. Nothing aggravates the symptoms. Nothing relieves the symptoms. She has tried nothing for the symptoms. The treatment provided no relief.  ? ?  ? ?Home Medications ?Prior to Admission medications   ?Medication Sig Start Date End Date Taking? Authorizing Provider  ?diclofenac Sodium (VOLTAREN) 1 % GEL Apply 2 g topically 4 (four) times daily. 04/30/22  Yes Caryl Ada K, PA-C  ?ibuprofen (ADVIL) 800 MG tablet Take 1 tablet (800 mg total) by mouth every 8 (eight) hours as needed. 10/06/21   Shelly Bombard, MD  ?   ? ?Allergies    ?Patient has no known allergies.   ? ?Review of Systems   ?Review of Systems  ?Cardiovascular:  Negative for chest pain.  ?Musculoskeletal:  Negative for joint swelling.  ?Skin:  Negative for color change, rash and wound.  ?All other systems reviewed and are negative. ? ?Physical Exam ?Updated Vital Signs ?BP (!) 142/103 (BP Location: Right Arm)   Pulse 68   Temp 98.4 ?F (36.9 ?C) (Oral)   Resp 18   Ht '5\' 2"'$  (1.575 m)   Wt 77.1 kg   LMP 04/18/2022   SpO2 99%   BMI 31.09 kg/m?  ?Physical Exam ?Vitals reviewed.  ?Constitutional:   ?   Appearance: Normal appearance.  ?HENT:  ?   Head: Normocephalic.  ?Musculoskeletal:     ?   General: Swelling and tenderness present.  ?    Comments: Tender left foot at 1st metatarsal /toe area,  nv and ns intact   ?Skin: ?   General: Skin is warm.  ?Neurological:  ?   General: No focal deficit present.  ?   Mental Status: She is alert.  ?Psychiatric:     ?   Mood and Affect: Mood normal.  ? ? ?ED Results / Procedures / Treatments   ?Labs ?(all labs ordered are listed, but only abnormal results are displayed) ?Labs Reviewed - No data to display ? ?EKG ?None ? ?Radiology ?No results found. ? ?Procedures ?Procedures  ? ? ?Medications Ordered in ED ?Medications - No data to display ? ?ED Course/ Medical Decision Making/ A&P ?  ?                        ?Medical Decision Making ?Amount and/or Complexity of Data Reviewed ?External Data Reviewed: radiology. ?   Details: previous xray reviwed  no fracture ? ?Risk ?Risk Details: Pt has not had a re injury   I doubt fracture.  Pt has a normal exam.  Pt given rx for voltaren gel  ? ? ? ? ? ? ? ? ? ? ?Final Clinical Impression(s) / ED Diagnoses ?Final diagnoses:  ?Foot  pain, left  ? ? ?Rx / DC Orders ?ED Discharge Orders   ? ?      Ordered  ?  diclofenac Sodium (VOLTAREN) 1 % GEL  4 times daily       ? 04/30/22 0727  ? ?  ?  ? ?  ? ?An After Visit Summary was printed and given to the patient. ? ?  ?Fransico Meadow, [place] ?04/30/22 1021 ? ?  ?Wyvonnia Dusky, MD ?04/30/22 1303 ? ?

## 2022-04-30 NOTE — Discharge Instructions (Addendum)
Follow up at Urgent care if pain persist ?

## 2022-08-04 IMAGING — CR DG FOOT COMPLETE 3+V*L*
3 series · 3 of 3 positions shown · non-contrast
Comparison: None.

CLINICAL DATA: Battery fell on great toe.

EXAM:
LEFT FOOT - COMPLETE 3+ VIEW

[foot ap]
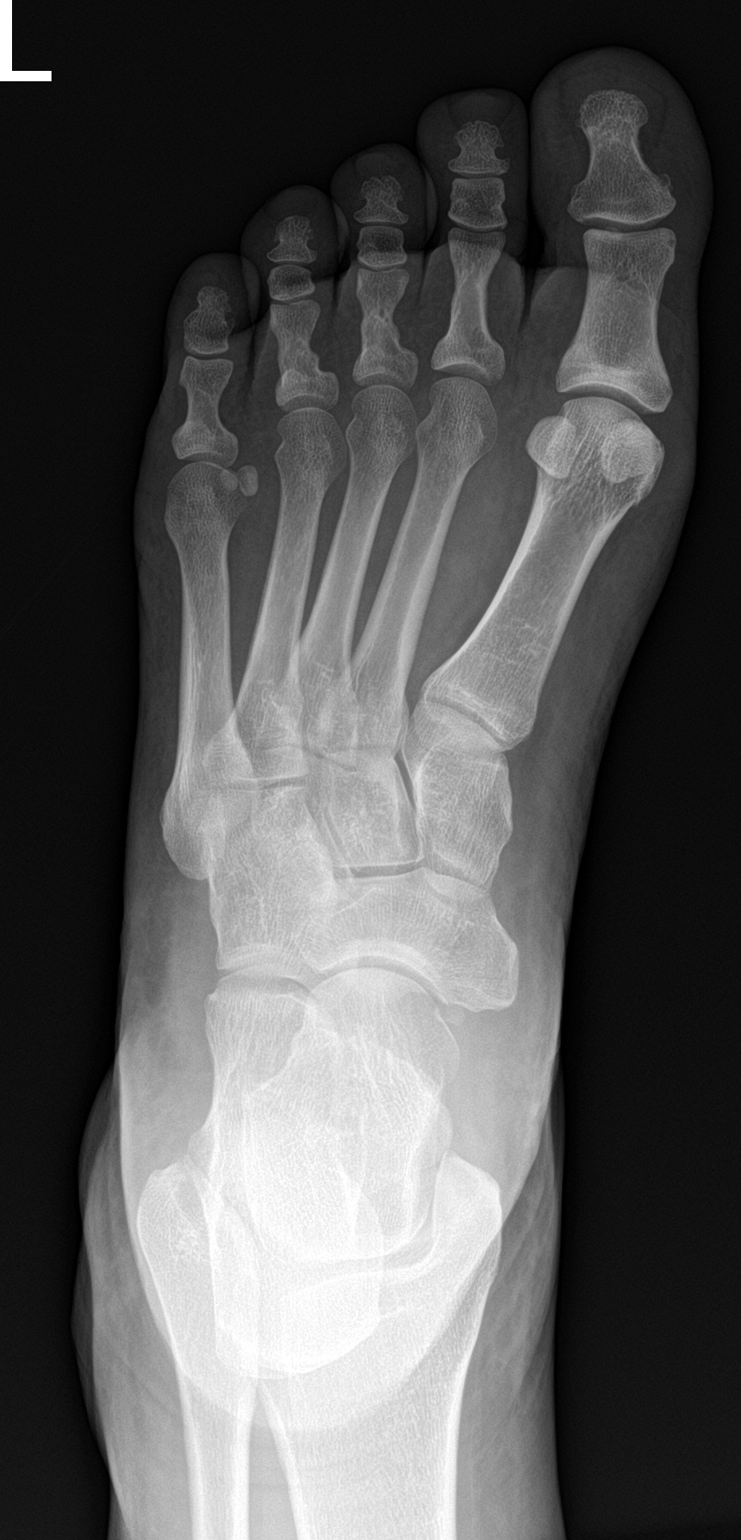

[foot obl]
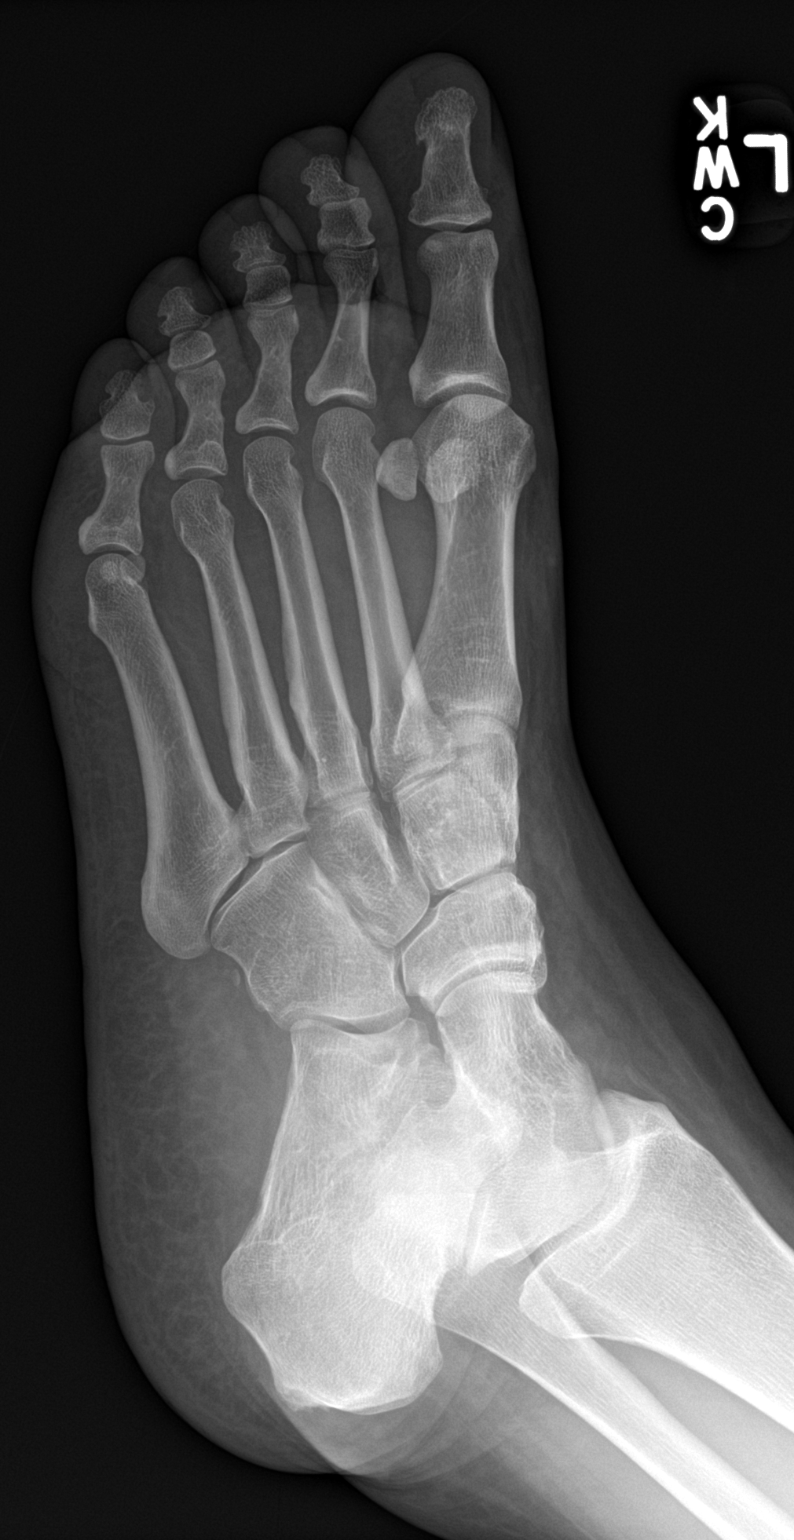

[foot lat]
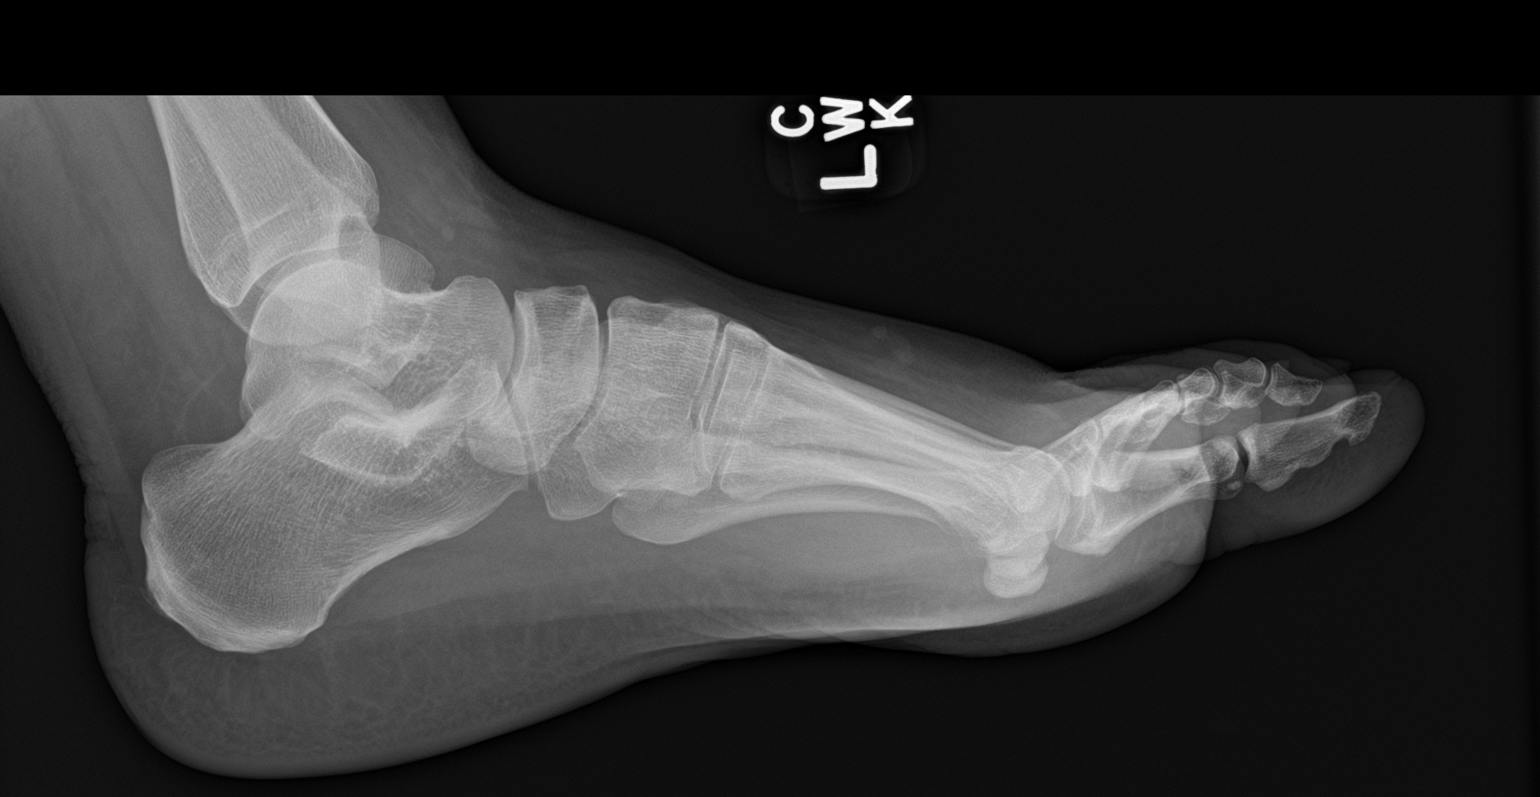

[3 of 3 positions shown; findings below may reference images not displayed]

FINDINGS: There is no evidence of fracture or dislocation. There is no
evidence of arthropathy or other focal bone abnormality. Soft tissue
swelling about the dorsal aspect of the great toe.
IMPRESSION: No evidence of fracture or dislocation

## 2022-09-01 DIAGNOSIS — R69 Illness, unspecified: Secondary | ICD-10-CM | POA: Diagnosis not present

## 2022-11-03 ENCOUNTER — Other Ambulatory Visit: Payer: Self-pay | Admitting: Geriatric Medicine

## 2022-11-03 DIAGNOSIS — R772 Abnormality of alphafetoprotein: Secondary | ICD-10-CM

## 2022-11-09 ENCOUNTER — Other Ambulatory Visit: Payer: Self-pay

## 2022-11-27 ENCOUNTER — Ambulatory Visit
Admission: RE | Admit: 2022-11-27 | Discharge: 2022-11-27 | Disposition: A | Discharge status: Home / Self Care | Payer: Self-pay | Source: Ambulatory Visit | Attending: Geriatric Medicine | Admitting: Geriatric Medicine

## 2022-11-27 DIAGNOSIS — R772 Abnormality of alphafetoprotein: Secondary | ICD-10-CM

## 2022-11-27 MED ORDER — GADOPICLENOL 0.5 MMOL/ML IV SOLN
8.0000 mL | Freq: Once | INTRAVENOUS | Status: AC | PRN
  Administered 2022-11-27: 8 mL via INTRAVENOUS

## 2023-01-11 ENCOUNTER — Ambulatory Visit: Payer: Self-pay | Admitting: Obstetrics and Gynecology

## 2023-01-12 ENCOUNTER — Ambulatory Visit: Payer: BLUE CROSS/BLUE SHIELD | Admitting: Obstetrics and Gynecology

## 2023-01-12 NOTE — Progress Notes (Signed)
Patient not seen by provider today.

## 2024-03-30 NOTE — Progress Notes (Signed)
 Pt advised to contact PCP in regards to elevated BP.

## 2024-05-07 NOTE — Progress Notes (Unsigned)
 Patient attended a screening event on 03/30/2024 where her BP screening results was 156/99. At the event the patient noted none for insurance coverage. Pt documented she does not smoke and does not live with someone who smokes. Patient declined having any SDOH insecurities. Pt listed pcp as Gabrielle Joiner, MD. Per chart review pt has a pcp listed in [facility] is Charle Congo, MD and Gabrielle Joiner, MD OBGYN. Patient was last seen by Margene Sheen, [place] at [facility] on 01/19/2024. On 01/19/2024 pt BP was 130/88. Patient was seen by Infectious Disease specialist on 05/03/2024. Patient has a future appt with Infectious Disease specialist at [facility] on 11/15/2024. Chart review does not indicate any SDOH insecurities and pt has private insurance.   Post event initial f/u [facility] called pt pcp office on 04/08/2024 to verify pt is currently a pt with pcp. No additional [facility] team support indicated at this time.
